# Patient Record
Sex: Female | Born: 1950 | Race: Black or African American | Hispanic: No | Smoking: Never smoker
Health system: Southern US, Community
[De-identification: ages and names within clinical notes are randomized; demographics above are authoritative.]

## PROBLEM LIST (undated history)

## (undated) DIAGNOSIS — E7800 Pure hypercholesterolemia, unspecified: Secondary | ICD-10-CM

## (undated) DIAGNOSIS — M259 Joint disorder, unspecified: Secondary | ICD-10-CM

## (undated) DIAGNOSIS — M159 Polyosteoarthritis, unspecified: Secondary | ICD-10-CM

## (undated) DIAGNOSIS — M1990 Unspecified osteoarthritis, unspecified site: Secondary | ICD-10-CM

## (undated) DIAGNOSIS — M722 Plantar fascial fibromatosis: Secondary | ICD-10-CM

## (undated) DIAGNOSIS — I10 Essential (primary) hypertension: Secondary | ICD-10-CM

## (undated) HISTORY — DX: Joint disorder, unspecified: M25.9

## (undated) HISTORY — DX: Pure hypercholesterolemia, unspecified: E78.00

## (undated) HISTORY — DX: Plantar fascial fibromatosis: M72.2

## (undated) HISTORY — DX: Morbid (severe) obesity due to excess calories: E66.01

## (undated) HISTORY — DX: Unspecified osteoarthritis, unspecified site: M19.90

## (undated) HISTORY — DX: Polyosteoarthritis, unspecified: M15.9

## (undated) HISTORY — DX: Essential (primary) hypertension: I10

---

## 2003-11-16 ENCOUNTER — Ambulatory Visit (HOSPITAL_COMMUNITY): Admission: RE | Admit: 2003-11-16 | Discharge: 2003-11-16 | Payer: Self-pay | Admitting: Internal Medicine

## 2005-09-24 ENCOUNTER — Ambulatory Visit (HOSPITAL_COMMUNITY): Admission: RE | Admit: 2005-09-24 | Discharge: 2005-09-24 | Payer: Self-pay | Admitting: Internal Medicine

## 2006-07-11 ENCOUNTER — Inpatient Hospital Stay (HOSPITAL_COMMUNITY): Admission: EM | Admit: 2006-07-11 | Discharge: 2006-07-18 | Payer: Self-pay | Admitting: Emergency Medicine

## 2010-05-04 ENCOUNTER — Encounter: Payer: Self-pay | Admitting: Internal Medicine

## 2010-06-11 ENCOUNTER — Encounter: Payer: Self-pay | Admitting: Orthopedic Surgery

## 2010-06-30 ENCOUNTER — Encounter: Payer: Self-pay | Admitting: Orthopedic Surgery

## 2010-07-02 ENCOUNTER — Encounter: Payer: Self-pay | Admitting: Orthopedic Surgery

## 2010-07-02 ENCOUNTER — Ambulatory Visit (INDEPENDENT_AMBULATORY_CARE_PROVIDER_SITE_OTHER): Payer: Managed Care, Other (non HMO) | Admitting: Orthopedic Surgery

## 2010-07-02 VITALS — HR 70 | Resp 18 | Ht 67.0 in | Wt 321.0 lb

## 2010-07-02 DIAGNOSIS — M1712 Unilateral primary osteoarthritis, left knee: Secondary | ICD-10-CM

## 2010-07-02 NOTE — Patient Instructions (Addendum)
Come back in 6 mos  If things worsen before that call us for a quicker appointment

## 2010-07-02 NOTE — Discharge Summary (Signed)
Separately identifiable x-ray report  AP lateral and patellofemoral view of LEFT knee  The alignment is Abnormal with severe varus, the joint spaces medially have collapsed to bone on bone. The bone quality appears to be normal. There are multiple osteophytes.  The impression is severe varus osteoarthritis.

## 2010-07-02 NOTE — Progress Notes (Signed)
   The patient is having pain in her LEFT knee for the last 12 years originally followed by orthopedist and even with since LEFT hand. She complains of sharp 10 out of 10 pain, which is constant and better with rest, worse with exercise. Notes particularly having trouble climbing the steps.  She takes tramadol for pain, which gives her really good relief.  She has had injections in this knee in the past.  Presents now for evaluation of her LEFT knee.  Review of systems patient reports weight gain and joint pain. Denies blurred vision chest pain, shortness of breath, heartburn, frequency, changes in the skin, numbness, nervousness, easy bleeding or bruising excessive thirst or adverse reactions to food  Vital signs are recorded as rate 312. Height 5 foot 7.  General: The patient is normally developed, with normal grooming and hygiene. There are no gross deformities. The body habitus is obese CDV: The pulse and perfusion of the extremities are normal   LYMPH: There is no gross lymphadenopathy in the extremities   Skin: There are no rashes, ulcers or cafe-au-lait spot   Psyche: The patient is alert, awake and oriented.  Mood is normal   Neuro:  The coordination and balance are normal.  Sensation is normal. Reflexes are 2+ and equal   Musculoskeletal   Upper extremity exam  Inspection and palpation revealed no abnormalities in the upper extremities.  Range of motion is full without contracture.  Motor exam is normal with grade 5 strength.  The joints are fully reduced without subluxation.  There is no atrophy or tremor and muscle tone is normal.  All joints are stable.   LEFT knee exam flexion is 125 she has full extension. Her x-rays exhibit varus, but she doesn't appear to be in varus when she stands. She has medial joint line tenderness. Her knee remained stable. Strength and muscle tone are normal. No joint effusion. RIGHT knee exam flexion 105. Stability normal. Motor  exam. Grade 5. Also, medial join tline tenderness seen. No joint effusion.

## 2011-01-06 ENCOUNTER — Encounter: Payer: Self-pay | Admitting: Orthopedic Surgery

## 2011-01-06 ENCOUNTER — Ambulatory Visit: Payer: Managed Care, Other (non HMO) | Admitting: Orthopedic Surgery

## 2011-03-31 ENCOUNTER — Ambulatory Visit: Payer: Managed Care, Other (non HMO) | Admitting: Orthopedic Surgery

## 2011-04-28 ENCOUNTER — Ambulatory Visit: Payer: Managed Care, Other (non HMO) | Admitting: Orthopedic Surgery

## 2011-05-21 ENCOUNTER — Ambulatory Visit: Payer: Managed Care, Other (non HMO) | Admitting: Orthopedic Surgery

## 2011-06-04 ENCOUNTER — Encounter: Payer: Self-pay | Admitting: Orthopedic Surgery

## 2011-06-04 ENCOUNTER — Ambulatory Visit (INDEPENDENT_AMBULATORY_CARE_PROVIDER_SITE_OTHER): Payer: Managed Care, Other (non HMO) | Admitting: Orthopedic Surgery

## 2011-06-04 VITALS — BP 100/60 | Ht 67.0 in | Wt 306.0 lb

## 2011-06-04 DIAGNOSIS — M1710 Unilateral primary osteoarthritis, unspecified knee: Secondary | ICD-10-CM

## 2011-06-04 DIAGNOSIS — M179 Osteoarthritis of knee, unspecified: Secondary | ICD-10-CM

## 2011-06-04 NOTE — Patient Instructions (Signed)
Call us when ready for knee replacement

## 2011-06-04 NOTE — Progress Notes (Signed)
Patient ID: Everlean Patterson, female   DOB: 1950-05-10, 61 y.o.   MRN: [id number] Chief Complaint  Patient presents with  . Follow-up    6 month recheck/x ray Lt knee   The patient has pain in her LEFT knee with weightbearing and it seems to be getting worse.  She inquired about Synvisc but she has grade 4 disease and we talked about it but decided not to go with that.  She's noticed her activities are becoming more difficult such as squatting bending kneeling getting in and out of a chair.  Fortunately the knee has not given out on her at this point  Exam shows he will be sent female well-developed well-nourished oriented x3 normal mood normal affect and plate without assistive device  She has a large thigh  cone-shaped tenderness medial and lateral compartments , medial predominant.  Knee stable.  Extension power normal skin intact pulse normal no lymph nodes sensation normal no pathological reflexes now is pretty good  X-ray shows grade 4 medial compartment disease with moderate varus deformity multiple bone spurs cyst formation and osteophytes and sclerosis  Date of x-ray June 04, 2011  Impression severe osteoarthritis LEFT knee  As we discussed the patient will need knee replacement surgery when her symptoms warrant he reaches to think about it talk to her husband and give back to [place] when she is ready for surgery.  She has exhausted nonoperative measures of injection oral anti-inflammatory naproxen oral medication tramadol most recently.

## 2011-06-04 NOTE — Progress Notes (Signed)
X-ray LEFT knee 3 views date June 04, 2011  The knee is in varus moderate The knee has complete loss of joint space medially with sclerosis, cyst formation and bone spurs.  The patellofemoral joint his disease as well with severe osteophytosis  Impression osteoarthritis LEFT knee severe varus deformity and disease.

## 2011-08-04 ENCOUNTER — Other Ambulatory Visit: Payer: Self-pay | Admitting: *Deleted

## 2011-08-04 ENCOUNTER — Telehealth: Payer: Self-pay | Admitting: Orthopedic Surgery

## 2011-08-04 NOTE — Telephone Encounter (Signed)
Surgery scheduled, called patient, left voicemail

## 2011-08-04 NOTE — Telephone Encounter (Signed)
Patient called about scheduling her surgery (total knee), for sometime in May.  States she needs a little time to make arrangements with work (at [employer].)  States "both knees are bad."    Notes from last visit here 06/03/01 do not indicate that patient needs to return to office for another visit prior to surgery.  Patient said she has not been to a class.  Please advise.  Ph# [phone] (Home)

## 2011-08-06 NOTE — Telephone Encounter (Signed)
Have left two voice mails for patient, will advise her of surgery when she returns call

## 2011-08-13 ENCOUNTER — Encounter: Payer: Self-pay | Admitting: Orthopedic Surgery

## 2011-08-13 ENCOUNTER — Telehealth: Payer: Self-pay | Admitting: Orthopedic Surgery

## 2011-08-13 NOTE — Telephone Encounter (Signed)
Reached patient.  Had sent letter regarding surgery date, pre-op, post op appointments, as had been unable to reach patient past few days with this information.  Relayed to patient today, 08/13/11.

## 2011-08-17 ENCOUNTER — Telehealth: Payer: Self-pay | Admitting: Orthopedic Surgery

## 2011-08-17 NOTE — Telephone Encounter (Signed)
Contacted insurer, [place], at ph [phone], regarding in-patient admit for surgery scheduled 08/31/11 at [facility].  Reached voice message system; left message and requested return call.  (Follow up by tomorrow 08/18/11, if no return call.)

## 2011-08-18 NOTE — Telephone Encounter (Signed)
Received call back - advised to contact pre-auth back during day hours at Ph#noted.

## 2011-08-19 NOTE — Telephone Encounter (Signed)
See updated note, patient was reached 08/13/11 and received all related information.

## 2011-08-19 NOTE — Telephone Encounter (Signed)
Reached Felicity Coyer in pre-authorization department at [employer], ph [phone].  Patient is approved for 3-day stay beginning 08/31/11 for CPT 27447, ICD9 [phone] total knee arthroplasty, [facility].  REF# [id number].

## 2011-08-24 ENCOUNTER — Telehealth: Payer: Self-pay | Admitting: Orthopedic Surgery

## 2011-08-24 NOTE — Telephone Encounter (Signed)
Patient called earlier today to relay that she needs to cancel her total knee surgery for 08/31/11; states she and husband have re-discussed it and she needs to wait awhile.  Nurse contacted all related parties.  I have called back to insurance company, [place] [employer], ph# [phone]; spoke with Blenda Nicely, who states that this authorization/reference: REF# [id number] is good for 90 days from pre-cert, therefore, good until 11/19/11, should patient wish to re-schedule before then. *We will need to close it out if patient does not have surgery by this date.

## 2011-08-27 ENCOUNTER — Encounter (HOSPITAL_COMMUNITY): Admission: RE | Admit: 2011-08-27 | Payer: PRIVATE HEALTH INSURANCE | Source: Ambulatory Visit

## 2011-08-31 ENCOUNTER — Encounter (HOSPITAL_COMMUNITY): Admission: RE | Payer: Self-pay | Source: Ambulatory Visit

## 2011-08-31 ENCOUNTER — Inpatient Hospital Stay (HOSPITAL_COMMUNITY)
Admission: RE | Admit: 2011-08-31 | Payer: PRIVATE HEALTH INSURANCE | Source: Ambulatory Visit | Admitting: Orthopedic Surgery

## 2011-08-31 SURGERY — ARTHROPLASTY, KNEE, TOTAL
Anesthesia: Choice | Laterality: Left

## 2011-09-02 ENCOUNTER — Other Ambulatory Visit (HOSPITAL_COMMUNITY): Payer: Managed Care, Other (non HMO)

## 2011-09-14 ENCOUNTER — Ambulatory Visit: Payer: PRIVATE HEALTH INSURANCE | Admitting: Orthopedic Surgery

## 2011-10-13 ENCOUNTER — Telehealth: Payer: Self-pay | Admitting: Orthopedic Surgery

## 2011-10-13 NOTE — Telephone Encounter (Signed)
Patient called to relay that she wishes to re-schedule the total knee replacement surgery that she cancelled for 08/31/11.  States her last day of work at [employer] is today; therefore, she can schedule any time.  Her last office visit here was 06/04/11.  Please advise if need to schedule another appointment or prior to scheduling?  I advised we would need to re-verify her insurance as well. Her phone # is [phone].

## 2011-11-18 NOTE — Telephone Encounter (Signed)
Patient has an appointment scheduled, tomorrow, 11/19/11. Aware.

## 2011-11-19 ENCOUNTER — Encounter: Payer: Self-pay | Admitting: Orthopedic Surgery

## 2011-11-19 ENCOUNTER — Ambulatory Visit (INDEPENDENT_AMBULATORY_CARE_PROVIDER_SITE_OTHER): Payer: PRIVATE HEALTH INSURANCE

## 2011-11-19 ENCOUNTER — Ambulatory Visit (INDEPENDENT_AMBULATORY_CARE_PROVIDER_SITE_OTHER): Payer: PRIVATE HEALTH INSURANCE | Admitting: Orthopedic Surgery

## 2011-11-19 VITALS — BP 120/70 | Ht 67.0 in | Wt 308.0 lb

## 2011-11-19 DIAGNOSIS — M25569 Pain in unspecified knee: Secondary | ICD-10-CM

## 2011-11-19 DIAGNOSIS — M25561 Pain in right knee: Secondary | ICD-10-CM

## 2011-11-19 NOTE — Patient Instructions (Addendum)
You have been scheduled for knee replacement surgery.  All surgeries carry some risk.  Remember you always have the option of continued nonsurgical treatment. However in this situation the risks vs. the benefits favor surgery as the best treatment option. The risks of the surgery includes the following but is not limited to bleeding, infection, pulmonary embolus, death from anesthesia, nerve injury vascular injury or need for further surgery, continued pain.  Specific to this procedure the following risks and complications are rare but possible Stiffness Pain  Infection which may require several subsequent surgeries including an amputation of the infection cannot be removed Instability   You have been scheduled for surgery  Please Go to your preoperative appointment and bring the folder that was given to you today  Please stop all blood thinners ibuprofen Naprosyn aspirin Plavix Coumadin  Total Knee Replacement In total knee replacement, the damaged knee is replaced with an artificial knee joint (prosthesis). The purpose of this surgery is to reduce pain and improve your range of motion. Regaining a near normal range of motion of your knee in the first 3 to 6 weeks after surgery is critical. Generally, these artificial joints last a minimum of 10 years. By that time, about 1 in 10 patients will need another surgery to repair the loose prosthesis. LET YOUR CAREGIVER KNOW ABOUT:    Allergies to food or medicine.   Medicines taken, including vitamins, herbs, eyedrops, over-the-counter medicines, and creams.   Use of steroids (by mouth or creams).   Previous problems with anesthetics or numbing medicines.   History of bleeding problems or blood clots.   Previous surgery.   Other health problems, including diabetes and kidney problems.   Possibility of pregnancy, if this applies.  RISKS AND COMPLICATIONS    Knee pain.   Loss of range of motion of the knee (stiffness) or instability.     Loosening of the prosthesis.   Infection.  BEFORE THE PROCEDURE    If you smoke, quit.   You may need a replacement or addition of blood (transfusion) during this procedure. You may want to donate your own blood for storage several weeks before the procedure. This way, your own blood can be stored and given to you if needed. Talk to your surgeon about this option.   Do not eat or drink anything for as long as directed by your caregiver before the procedure.   You may bathe and brush your teeth before the procedure. Do not swallow the water when brushing your teeth.   Scrub the area to be operated on for 5 minutes in the morning before the procedure. Use special soap if you are directed to do so by your caregiver.   Take your regular medicines with a small sip of water unless otherwise instructed. Your caregiver will let you know if there are medicines that need to be stopped and for how long.   You should be present 60 minutes before your procedure or as directed by your caregiver.  PROCEDURE   Before surgery, an intravenous (IV) access for giving fluids will be started. You will be given medicines and/or gas to make you sleep (anesthetic). You may be given medicines in your back with a needle to make you numb from the waist down. Your surgeon will take out any damaged cartilage and bone. He or she will then put in new metal, plastic, or ceramic joint surfaces to restore alignment and function to your knee. AFTER THE PROCEDURE   You  will be taken to the recovery area where a nurse will watch and check your progress. You may have a long, narrow tube (catheter) in your bladder after surgery. The catheter helps you empty your bladder (pass your urine). You may have drainage tubes coming out from under the dressing. These tubes attach to a device that removes blood or fluids that gather after surgery. Once you are awake, stable, and taking fluids well, you will be returned to your room. You will  receive physical therapy as prescribed by your caregiver. If you do not have help at home, you may need to go to an extended care facility for a few weeks. If you are sent home with a continuous passive motion (CPM) machine, use it as instructed. Document Released: 07/06/2000 Document Revised: 03/19/2011 Document Reviewed: 01/30/2009 [facility] Patient Information 2012 [place], [place].Preparing for Knee Replacement Recovery from knee replacement surgery can be made easier and more comfortable by taking steps to be prepared before surgery. This includes:  Arranging for others to help you.   Preparing your home.   Making sure your body is prepared by doing a pre-operative exam and being as healthy as you can.   Doing exercises before your surgery as told by your caregiver.  Also, you can ease any concerns about your financial responsibilities by calling your insurance company after you decide to have surgery. In addition to your surgery and hospital stay, you will want to ask about your coverage for medical equipment, rehab facilities, and home care. ARRANGING FOR HELP   You will be getting stronger and more mobile every day. However, in the first couple weeks after surgery, it is unlikely you will be able to do all your daily activities as easily as before your surgery. You will tire easily and still have limited movement in your leg. Follow these guidelines to best arrange for the help you may need after your surgery:  Arrange for someone to drive you home from the hospital. Your surgeon will be able to tell you how many days you can expect to be in the hospital.   Cancel all work, care-giving, and volunteer responsibilities for at least 4 to 6 weeks after your surgery.   If you live alone, arrange for someone to care for your home and pets for the first 4 to 6 weeks.   Select someone with whom you feel comfortable to be with you day and night for the first week. This person will help you with  your exercises and personal care, like bathing and using the toilet.   Arrange for drivers to bring you to and from your doctor and therapy appointments, as well as to the grocery store and other places you need to go, for at least 4 to 6 weeks.   Select 2 or 3 rehab facilities where you would be comfortable recovering just in case you are not able to go directly home to recover.  PREPARING YOUR HOME  Remove all clutter from your floors.   To see if you will be able to move in these spaces with a wheeled walker, hold your hands out about 6 inches from your sides. Then walk from your bed to the bathroom. Walk from your resting spot to your kitchen and bathroom. If you do not hit anything with your hands, you probably have enough room.   Remove throw rugs.   Move the items you use often to shelves and drawers that are at countertop height.  Move items in  your bathroom, kitchen, and bedroom.   Prepare a few meals that you can freeze and reheat later.   Do not plan on recovering in bed.  It is better for your health to sit upright. You may wish to use a recliner with a small table nearby. Keep the items you use most frequently on that table. These may include the TV remote, a cordless phone, a book or laptop computer, water glass, and any other items of your choice.   Consider adding grab bars in the shower and near the toilet.   While you are in the hospital, you will learn about equipment helpful for your recovery. Some of the equipment includes raised toilet seats, tub benches, and shower benches. Often, your hospital care team will help you decide what you need and can direct you about where to buy these items. You may not need all of these items, and they are not often returnable, so it is not recommended that you buy them before going to the hospital.  PREPARING YOUR BODY  Complete a pre-operative exam. This will ensure that your body is healthy enough to safely complete this surgery. Be  certain to bring a complete list of all your medicines and supplements (herbs and vitamins). You may be advised to have additional tests to ensure your safety.   Complete elective dental care and routine cleanings before the surgery. Germs from anywhere in your body, including those in your mouth, can travel to your new joint and infect it.  It is important not to have any dental work performed for at least 3 months after your surgery. After surgery, be sure to tell your dentist about your joint replacement.   Maintain a healthy diet. Unless advised by your surgeon, do not drastically change your diet before your surgery.   Quit smoking. Get help from your caregiver if you need it.   The day before your surgery, follow your surgeon's directions for showering, eating and drinking. These directions are for your safety.  EXERCISES Your caregiver may have you do the following exercises before your surgery. Be sure to follow the exercise program your caregiver prescribes for you. Doing the exercises on both sides will help prepare your "good" side as well. While completing these exercises, remember:    Stretch as long as you can, up to 30 seconds, without pain developing.   You should only feel a gentle lengthening or release in the stretched tissue.  Ankle Pumps 1. While sitting with your knees straight, draw the top of your feet upwards by flexing your ankles.   2. Then, reverse the motion, pointing your toes downward.  3. Repeat 10 to 20 times. Complete this exercise 1 to 2 times per day.  Heel Slides 1. Lie on your back with both knees straight. (If this causes back discomfort, bend your opposite knee, placing your foot flat on the floor.)  2. Slowly slide your heel back toward your buttocks until you feel a gentle stretch in the front of your knee or thigh.  3. Slowly slide your heel back to the starting position.  4. Repeat 10 to 20 times. Complete this exercise 1 to 2 times per day.    Quadriceps Sets 1. Lie on your back with your sore leg extended and your opposite knee bent.  2. Gradually tense the muscles in the front of your thigh. You should see either your kneecap slide up toward your hip or increased dimpling just above the knee. This motion  will push the back of the knee down toward the floor.   3. Hold the muscle as tight as you can without increasing your pain for 10 seconds.  4. Relax the muscles slowly and completely in between each repetition.  5. Repeat 10 to 20 times. Complete this exercise 1 to 2 times per day.  Short Arc Kicks 1. Lie on your back. Place a 4 to 6 inch towel roll under your sore knee so that the knee slightly bends.  2. Raise only your lower leg by tightening the muscles in the front of your thigh. Do not allow your thigh to rise.  3. Hold this position for 5 seconds.  4. Repeat 10 to 20 times. Complete this exercise 1 to 2 times per day.  Straight Leg Raises 1. Lie on your back with your sore leg extended and your opposite knee bent.   2. Tense the muscles in the front of your sore thigh. You should see either your kneecap slide up or increased dimpling just above the knee. Your thigh may even quiver.  3. Tighten these muscles even more and raise your leg 4 to 6 inches off the floor. Hold for 3 to 5 seconds.  4. Keeping these muscles tense, lower your leg.  5. Relax the muscles slowly and completely in between each repetition.  6. Repeat 10 to 20 times. Complete this exercise 1 to 2 times per day.  Arm Chair Push-ups 1. Find a firm, non-wheeled chair with solid armrests.  2. Sitting in the chair, extend your sore leg straight out in front of you.  3. Lift up your body weight, using your arms and opposite leg.  4. Slowly lower your body weight.   5. Repeat 10 to 20 times. Complete this exercise 1 to 2 times per day.  Document Released: 07/04/2010 Document Revised: 03/19/2011 Document Reviewed: 07/04/2010 [facility] Patient Information 2012  [place], [place].

## 2011-11-19 NOTE — Progress Notes (Signed)
Patient ID: [name] Price, female   DOB: 1950-04-21, 61 y.o.   MRN: [id number] Chief Complaint  Patient presents with  . Follow-up    discuss TKA, was previously scheduled for left tka but cancelled, states today that right knee is worse than left    BP 120/70  Ht 5\' 7"  (1.702 m)  Wt 308 lb (139.708 kg)  BMI 48.24 kg/m2  Past Medical History  Diagnosis Date  . Diabetes mellitus   . HTN (hypertension)   . High cholesterol   . Arthritis     No past surgical history on file.  Current Outpatient Prescriptions on File Prior to Visit  Medication Sig Dispense Refill  . GLIPIZIDE PO Take by mouth.        Marland Kitchen LISINOPRIL PO Take by mouth.        . METFORMIN HCL PO Take by mouth.        Marland Kitchen NAPROXEN PO Take by mouth.        Marland Kitchen SIMVASTATIN PO Take by mouth.        . TRAMADOL HCL PO Take by mouth.         History  Substance Use Topics  . Smoking status: Never Smoker   . Smokeless tobacco: Not on file  . Alcohol Use: Not on file   See h/p by ref

## 2011-11-20 ENCOUNTER — Telehealth: Payer: Self-pay | Admitting: Orthopedic Surgery

## 2011-11-20 NOTE — Telephone Encounter (Signed)
Contacted insurer, [employer] at ph # [phone], (the contact provider for [place] [employer]) for pre-authorization for surgery (in-patient/admit).  Surgery schedule date is 11/30/11 at [facility], CPT code 09811, ICD9 [phone] total knee arthroplasty;  Reached automated message (X two) - states "technical difficulty" and to please try back later, as they are working to resolve the issue.

## 2011-11-23 ENCOUNTER — Encounter (HOSPITAL_COMMUNITY): Payer: Self-pay

## 2011-11-23 ENCOUNTER — Encounter (HOSPITAL_COMMUNITY)
Admission: RE | Admit: 2011-11-23 | Discharge: 2011-11-23 | Disposition: A | Discharge status: Home / Self Care | Payer: 59 | Source: Ambulatory Visit | Attending: Orthopedic Surgery | Admitting: Orthopedic Surgery

## 2011-11-23 LAB — CBC WITH DIFFERENTIAL/PLATELET
Basophils Absolute: 0 10*3/uL (ref 0.0–0.1)
Basophils Relative: 0 % (ref 0–1)
Lymphocytes Relative: 33 % (ref 12–46)
Neutro Abs: 4.2 10*3/uL (ref 1.7–7.7)
Neutrophils Relative %: 60 % (ref 43–77)
Platelets: 212 10*3/uL (ref 150–400)
RDW: 14 % (ref 11.5–15.5)
WBC: 6.9 10*3/uL (ref 4.0–10.5)

## 2011-11-23 LAB — SURGICAL PCR SCREEN: MRSA, PCR: NEGATIVE

## 2011-11-23 LAB — BASIC METABOLIC PANEL
Calcium: 9.9 mg/dL (ref 8.4–10.5)
Chloride: 99 mEq/L (ref 96–112)
Creatinine, Ser: 0.77 mg/dL (ref 0.50–1.10)
GFR calc Af Amer: >90 mL/min (ref >90)
GFR calc non Af Amer: 88 mL/min — ABNORMAL LOW (ref >90)

## 2011-11-23 LAB — PROTIME-INR: INR: 0.96 (ref 0.00–1.49)

## 2011-11-23 LAB — APTT: aPTT: 26 seconds (ref 24–37)

## 2011-11-23 LAB — PREPARE RBC (CROSSMATCH)

## 2011-11-23 MED ORDER — CHLORHEXIDINE GLUCONATE 4 % EX LIQD: 60.0000 mL | Freq: Once | CUTANEOUS | Status: DC

## 2011-11-23 NOTE — Patient Instructions (Addendum)
20 [name] Price  11/23/2011   Your procedure is scheduled on:  11/30/11  Report to [facility] at 06:15 AM.  Call this number if you have problems the morning of surgery: [phone]   Remember:   Do not eat or drink:After Midnight.  Take these medicines the morning of surgery with A SIP OF WATER: Lisinopril-HCTZ   Do not wear jewelry, make-up or nail polish.  Do not wear lotions, powders, or perfumes. You may wear deodorant.  Do not shave 48 hours prior to surgery. Men may shave face and neck.  Do not bring valuables to the hospital.  Contacts, dentures or bridgework may not be worn into surgery.   Patients discharged the day of surgery will not be allowed to drive home.  Special Instructions: CHG Shower Use Special Wash: 1/2 bottle night before surgery and 1/2 bottle morning of surgery.   Please read over the following fact sheets that you were given: Pain Booklet, Blood Transfusion Information, Total Joint Packet, MRSA Information, Surgical Site Infection Prevention, Anesthesia Post-op Instructions and Care and Recovery After Surgery    Total Knee Replacement In total knee replacement, the damaged knee is replaced with an artificial knee joint (prosthesis). The purpose of this surgery is to reduce pain and improve your range of motion. Regaining a near normal range of motion of your knee in the first 3 to 6 weeks after surgery is critical. Generally, these artificial joints last a minimum of 10 years. By that time, about 1 in 10 patients will need another surgery to repair the loose prosthesis. LET YOUR CAREGIVER KNOW ABOUT:   Allergies to food or medicine.   Medicines taken, including vitamins, herbs, eyedrops, over-the-counter medicines, and creams.   Use of steroids (by mouth or creams).   Previous problems with anesthetics or numbing medicines.   History of bleeding problems or blood clots.   Previous surgery.   Other health problems, including diabetes and kidney  problems.   Possibility of pregnancy, if this applies.  RISKS AND COMPLICATIONS   Knee pain.   Loss of range of motion of the knee (stiffness) or instability.   Loosening of the prosthesis.   Infection.  BEFORE THE PROCEDURE   If you smoke, quit.   You may need a replacement or addition of blood (transfusion) during this procedure. You may want to donate your own blood for storage several weeks before the procedure. This way, your own blood can be stored and given to you if needed. Talk to your surgeon about this option.   Do not eat or drink anything for as long as directed by your caregiver before the procedure.   You may bathe and brush your teeth before the procedure. Do not swallow the water when brushing your teeth.   Scrub the area to be operated on for 5 minutes in the morning before the procedure. Use special soap if you are directed to do so by your caregiver.   Take your regular medicines with a small sip of water unless otherwise instructed. Your caregiver will let you know if there are medicines that need to be stopped and for how long.   You should be present 60 minutes before your procedure or as directed by your caregiver.  PROCEDURE  Before surgery, an intravenous (IV) access for giving fluids will be started. You will be given medicines and/or gas to make you sleep (anesthetic). You may be given medicines in your back with a needle to make you numb  from the waist down. Your surgeon will take out any damaged cartilage and bone. He or she will then put in new metal, plastic, or ceramic joint surfaces to restore alignment and function to your knee. AFTER THE PROCEDURE  You will be taken to the recovery area where a nurse will watch and check your progress. You may have a long, narrow tube (catheter) in your bladder after surgery. The catheter helps you empty your bladder (pass your urine). You may have drainage tubes coming out from under the dressing. These tubes attach  to a device that removes blood or fluids that gather after surgery. Once you are awake, stable, and taking fluids well, you will be returned to your room. You will receive physical therapy as prescribed by your caregiver. If you do not have help at home, you may need to go to an extended care facility for a few weeks. If you are sent home with a continuous passive motion (CPM) machine, use it as instructed. Document Released: 07/06/2000 Document Revised: 03/19/2011 Document Reviewed: 01/30/2009 [facility] Patient Information 2012 [place], [place].   PATIENT INSTRUCTIONS POST-ANESTHESIA  IMMEDIATELY FOLLOWING SURGERY:  Do not drive or operate machinery for the first twenty four hours after surgery.  Do not make any important decisions for twenty four hours after surgery or while taking narcotic pain medications or sedatives.  If you develop intractable nausea and vomiting or a severe headache please notify your doctor immediately.  FOLLOW-UP:  Please make an appointment with your surgeon as instructed. You do not need to follow up with anesthesia unless specifically instructed to do so.  WOUND CARE INSTRUCTIONS (if applicable):  Keep a dry clean dressing on the anesthesia/puncture wound site if there is drainage.  Once the wound has quit draining you may leave it open to air.  Generally you should leave the bandage intact for twenty four hours unless there is drainage.  If the epidural site drains for more than 36-48 hours please call the anesthesia department.  QUESTIONS?:  Please feel free to call your physician or the hospital operator if you have any questions, and they will be happy to assist you.

## 2011-11-23 NOTE — Telephone Encounter (Signed)
Called back to try again. Reached voice message.  Follow back up to speak w/rep.

## 2011-11-24 NOTE — Telephone Encounter (Addendum)
Called back again to reach pre-cert department re: request pre-authorization, due to "technical difficulty" ; this time given ph# of [employer]'s direct ph# [phone]; reached pre-certification representative, Erika C.  Received approval for 3 day stay for CPT 27447, ICD9 715.96, 715.16, under Pre-auth# [id number], subject to verification by [facility] upon receipt of claim.

## 2011-11-25 ENCOUNTER — Telehealth: Payer: Self-pay | Admitting: Orthopedic Surgery

## 2011-11-25 NOTE — Telephone Encounter (Signed)
Patient called, relayed that [facility], at pre-op appointment, had asked patient to contact our office to ask if she would need any equipment following surgery, such as wheelchair, walker, etc. Patient ph# is [phone].

## 2011-11-26 NOTE — Telephone Encounter (Signed)
Advised patient this will be handled before she leaves the hospital

## 2011-11-29 NOTE — H&P (Signed)
[name] Price is an 61 y.o. female.   Chief Complaint: Right knee pain times several  HPI: This 61 year old female presents with a severely diseased right knee. She complains of severe pain in the right knee nonradiating associated with stiffness a feeling of locking of the joint. She has lost her ability to perform normal activities of daily living including squatting kneeling bending getting out of a chair sitting for long periods of time. She has failed nonoperative treatment and wishes to proceed with knee replacement surgery. She has been counseled on the risks and benefits of the procedure including written educational material.  Past Medical History  Diagnosis Date  . Diabetes mellitus   . HTN (hypertension)   . High cholesterol   . Arthritis     No past surgical history on file.  Family History  Problem Relation Age of Onset  . Heart disease    . Arthritis    . Lung disease    . Kidney disease    . Coronary artery disease Mother   . Diabetes Sister   . Heart attack Brother   . Diabetes Son    Social History:  reports that she has never smoked. She does not have any smokeless tobacco history on file. She reports that she does not drink alcohol or use illicit drugs.  Allergies: No Known Allergies  No prescriptions prior to admission    No results found for this or any previous visit (from the past 48 hour(s)). No results found.  Review of Systems  Constitutional: Negative for fever.  Eyes: Negative for blurred vision.  Respiratory: Negative for cough.   Cardiovascular: Negative for chest pain.  Gastrointestinal: Negative for constipation and blood in stool.  Genitourinary: Negative for dysuria.  Musculoskeletal: Positive for joint pain.  Skin: Negative for rash.  Neurological: Negative for dizziness and headaches.  Endo/Heme/Allergies: Does not bruise/bleed easily.  Psychiatric/Behavioral: Negative for depression.    There were no vitals taken for this  visit. Physical Exam  Constitutional: She is oriented to person, place, and time. She appears well-developed and well-nourished.  HENT:  Head: Normocephalic.  Eyes: Pupils are equal, round, and reactive to light.  Neck: Normal range of motion. No tracheal deviation present.  Cardiovascular: Normal rate and intact distal pulses.   Respiratory: Effort normal. No stridor.  GI: She exhibits no distension.  Lymphadenopathy:    She has no cervical adenopathy.  Neurological: She is alert and oriented to person, place, and time. She has normal reflexes. She displays normal reflexes. No cranial nerve deficit. She exhibits normal muscle tone. Coordination normal.  Skin: Skin is warm and dry. No rash noted. No erythema.  Psychiatric: She has a normal mood and affect. Her behavior is normal. Judgment and thought content normal.  Right Knee Exam   Tenderness  The patient is experiencing tenderness in the lateral retinaculum and medial joint line.  Range of Motion  Extension: 5  Flexion: 120   Muscle Strength   The patient has normal right knee strength.  Tests  McMurray:  Medial - negative Lateral - negative Drawer:       Anterior - negative    Posterior - negative Varus: negative Valgus: negative Patellar Apprehension: negative  Other  Erythema: absent Scars: absent Sensation: normal Pulse: present Swelling: none   Left Knee Exam   Tenderness  The patient is experiencing tenderness in the medial joint line and lateral retinaculum.  Range of Motion  Extension: 5  Flexion: 120  Muscle Strength   The patient has normal left knee strength.  Tests  McMurray:  Medial - negative Lateral - negative Drawer:       Anterior - negative     Posterior - negative Varus: negative Valgus: negative Patellar Apprehension: negative  Other  Erythema: absent Scars: absent Sensation: normal Pulse: present Swelling: none    Upper extremity exam  Inspection and palpation  revealed no abnormalities in the upper extremities.  Range of motion is full without contracture.  Motor exam is normal with grade 5 strength.  The joints are fully reduced without subluxation.  There is no atrophy or tremor and muscle tone is normal.  All joints are stable.   IMAGING: OA RIGHT KNEE   Assessment/Plan OA RIGHT KNEE   RT TKA  This procedure has been fully reviewed with the patient and written informed consent has been obtained.   [name] Price 11/29/2011, 9:22 AM

## 2011-11-30 ENCOUNTER — Inpatient Hospital Stay (HOSPITAL_COMMUNITY): Payer: 59 | Admitting: Anesthesiology

## 2011-11-30 ENCOUNTER — Inpatient Hospital Stay (HOSPITAL_COMMUNITY)
Admission: RE | Admit: 2011-11-30 | Discharge: 2011-12-03 | DRG: 470 | Disposition: A | Discharge status: Home Health | Payer: 59 | Source: Ambulatory Visit | Attending: Orthopedic Surgery | Admitting: Orthopedic Surgery

## 2011-11-30 ENCOUNTER — Other Ambulatory Visit: Payer: Self-pay | Admitting: *Deleted

## 2011-11-30 ENCOUNTER — Encounter (HOSPITAL_COMMUNITY): Payer: Self-pay | Admitting: *Deleted

## 2011-11-30 ENCOUNTER — Inpatient Hospital Stay (HOSPITAL_COMMUNITY): Payer: 59

## 2011-11-30 ENCOUNTER — Encounter (HOSPITAL_COMMUNITY): Payer: Self-pay | Admitting: Anesthesiology

## 2011-11-30 ENCOUNTER — Encounter (HOSPITAL_COMMUNITY)
Admission: RE | Disposition: A | Discharge status: Home Health | Payer: Self-pay | Source: Ambulatory Visit | Attending: Orthopedic Surgery

## 2011-11-30 DIAGNOSIS — IMO0002 Reserved for concepts with insufficient information to code with codable children: Secondary | ICD-10-CM

## 2011-11-30 DIAGNOSIS — I10 Essential (primary) hypertension: Secondary | ICD-10-CM | POA: Diagnosis present

## 2011-11-30 DIAGNOSIS — Z833 Family history of diabetes mellitus: Secondary | ICD-10-CM

## 2011-11-30 DIAGNOSIS — Z23 Encounter for immunization: Secondary | ICD-10-CM

## 2011-11-30 DIAGNOSIS — E119 Type 2 diabetes mellitus without complications: Secondary | ICD-10-CM | POA: Diagnosis present

## 2011-11-30 DIAGNOSIS — Z8249 Family history of ischemic heart disease and other diseases of the circulatory system: Secondary | ICD-10-CM

## 2011-11-30 DIAGNOSIS — E7800 Pure hypercholesterolemia, unspecified: Secondary | ICD-10-CM | POA: Diagnosis present

## 2011-11-30 DIAGNOSIS — M129 Arthropathy, unspecified: Secondary | ICD-10-CM | POA: Diagnosis present

## 2011-11-30 DIAGNOSIS — M1710 Unilateral primary osteoarthritis, unspecified knee: Principal | ICD-10-CM | POA: Diagnosis present

## 2011-11-30 HISTORY — PX: TOTAL KNEE ARTHROPLASTY: SHX125

## 2011-11-30 LAB — GLUCOSE, CAPILLARY: Glucose-Capillary: 156 mg/dL — ABNORMAL HIGH (ref 70–99)

## 2011-11-30 SURGERY — ARTHROPLASTY, KNEE, TOTAL
Anesthesia: Spinal | Site: Knee | Laterality: Right | Wound class: Clean

## 2011-11-30 MED ORDER — SENNOSIDES-DOCUSATE SODIUM 8.6-50 MG PO TABS
1.0000 | ORAL_TABLET | Freq: Every evening | ORAL | Status: DC | PRN
  Filled 2011-11-30: qty 1

## 2011-11-30 MED ORDER — BUPIVACAINE HCL (PF) 0.25 % IJ SOLN
INTRAMUSCULAR | Status: AC
  Filled 2011-11-30: qty 60

## 2011-11-30 MED ORDER — MIDAZOLAM HCL 2 MG/2ML IJ SOLN
INTRAMUSCULAR | Status: AC
  Filled 2011-11-30: qty 2

## 2011-11-30 MED ORDER — CELECOXIB 100 MG PO CAPS
200.0000 mg | ORAL_CAPSULE | Freq: Two times a day (BID) | ORAL | Status: DC
  Administered 2011-12-01 – 2011-12-03 (×5): 200 mg via ORAL
  Filled 2011-11-30 (×8): qty 2

## 2011-11-30 MED ORDER — ASPIRIN EC 325 MG PO TBEC
325.0000 mg | DELAYED_RELEASE_TABLET | Freq: Two times a day (BID) | ORAL | Status: DC
  Administered 2011-11-30 – 2011-12-03 (×7): 325 mg via ORAL
  Filled 2011-11-30 (×7): qty 1

## 2011-11-30 MED ORDER — BUPIVACAINE-EPINEPHRINE (PF) 0.5% -1:200000 IJ SOLN
INTRAMUSCULAR | Status: DC | PRN
  Administered 2011-11-30: 60 mL

## 2011-11-30 MED ORDER — PREGABALIN 50 MG PO CAPS
50.0000 mg | ORAL_CAPSULE | Freq: Once | ORAL | Status: AC
  Administered 2011-11-30: 50 mg via ORAL

## 2011-11-30 MED ORDER — OXYCODONE HCL 5 MG PO TABS
5.0000 mg | ORAL_TABLET | Freq: Once | ORAL | Status: AC
  Administered 2011-11-30: 5 mg via ORAL

## 2011-11-30 MED ORDER — CELECOXIB 100 MG PO CAPS
ORAL_CAPSULE | ORAL | Status: AC
  Filled 2011-11-30: qty 4

## 2011-11-30 MED ORDER — BUPIVACAINE IN DEXTROSE 0.75-8.25 % IT SOLN
INTRATHECAL | Status: AC
  Filled 2011-11-30: qty 2

## 2011-11-30 MED ORDER — PHENYLEPHRINE HCL 10 MG/ML IJ SOLN
INTRAMUSCULAR | Status: AC
  Filled 2011-11-30: qty 1

## 2011-11-30 MED ORDER — PREGABALIN 50 MG PO CAPS
ORAL_CAPSULE | ORAL | Status: AC
  Filled 2011-11-30: qty 1

## 2011-11-30 MED ORDER — EPHEDRINE SULFATE 50 MG/ML IJ SOLN
INTRAMUSCULAR | Status: DC | PRN
  Administered 2011-11-30 (×3): 10 mg via INTRAVENOUS

## 2011-11-30 MED ORDER — GLIPIZIDE 5 MG PO TABS
5.0000 mg | ORAL_TABLET | Freq: Two times a day (BID) | ORAL | Status: DC
  Administered 2011-11-30 – 2011-12-03 (×6): 5 mg via ORAL
  Filled 2011-11-30 (×6): qty 1

## 2011-11-30 MED ORDER — METOCLOPRAMIDE HCL 10 MG PO TABS: 5.0000 mg | ORAL_TABLET | Freq: Three times a day (TID) | ORAL | Status: DC | PRN

## 2011-11-30 MED ORDER — ONDANSETRON HCL 4 MG PO TABS: 4.0000 mg | ORAL_TABLET | Freq: Four times a day (QID) | ORAL | Status: DC | PRN

## 2011-11-30 MED ORDER — PROPOFOL 10 MG/ML IV EMUL
INTRAVENOUS | Status: DC | PRN
  Administered 2011-11-30: 50 ug/kg/min via INTRAVENOUS

## 2011-11-30 MED ORDER — ACETAMINOPHEN 10 MG/ML IV SOLN
1000.0000 mg | Freq: Four times a day (QID) | INTRAVENOUS | Status: AC
  Administered 2011-11-30 – 2011-12-01 (×3): 1000 mg via INTRAVENOUS
  Filled 2011-11-30 (×3): qty 100

## 2011-11-30 MED ORDER — ONDANSETRON HCL 4 MG/2ML IJ SOLN: 4.0000 mg | Freq: Four times a day (QID) | INTRAMUSCULAR | Status: DC | PRN

## 2011-11-30 MED ORDER — BUPIVACAINE ON-Q PAIN PUMP (FOR ORDER SET NO CHG)
INJECTION | Status: AC
  Filled 2011-11-30: qty 1

## 2011-11-30 MED ORDER — DEXTROSE 5 % IV SOLN
500.0000 mg | Freq: Four times a day (QID) | INTRAVENOUS | Status: DC | PRN
  Filled 2011-11-30: qty 5

## 2011-11-30 MED ORDER — PNEUMOCOCCAL VAC POLYVALENT 25 MCG/0.5ML IJ INJ
0.5000 mL | INJECTION | INTRAMUSCULAR | Status: AC
  Administered 2011-12-01: 0.5 mL via INTRAMUSCULAR
  Filled 2011-11-30: qty 0.5

## 2011-11-30 MED ORDER — LACTATED RINGERS IV SOLN
INTRAVENOUS | Status: DC
  Administered 2011-11-30: 1000 mL via INTRAVENOUS

## 2011-11-30 MED ORDER — BUPIVACAINE-EPINEPHRINE PF 0.5-1:200000 % IJ SOLN
INTRAMUSCULAR | Status: AC
  Filled 2011-11-30: qty 20

## 2011-11-30 MED ORDER — SENNA 8.6 MG PO TABS
1.0000 | ORAL_TABLET | Freq: Two times a day (BID) | ORAL | Status: DC
  Administered 2011-11-30 – 2011-12-03 (×7): 8.6 mg via ORAL
  Filled 2011-11-30 (×7): qty 1

## 2011-11-30 MED ORDER — ACETAMINOPHEN 10 MG/ML IV SOLN
1000.0000 mg | Freq: Once | INTRAVENOUS | Status: AC
  Administered 2011-11-30: 1000 mg via INTRAVENOUS

## 2011-11-30 MED ORDER — LIDOCAINE HCL (PF) 1 % IJ SOLN
INTRAMUSCULAR | Status: AC
  Filled 2011-11-30: qty 5

## 2011-11-30 MED ORDER — HYDROMORPHONE HCL PF 1 MG/ML IJ SOLN
0.5000 mg | INTRAMUSCULAR | Status: DC | PRN
  Administered 2011-11-30 (×3): 0.5 mg via INTRAVENOUS
  Filled 2011-11-30 (×3): qty 1

## 2011-11-30 MED ORDER — LIDOCAINE HCL (CARDIAC) 10 MG/ML IV SOLN
INTRAVENOUS | Status: DC | PRN
  Administered 2011-11-30: 50 mg via INTRAVENOUS

## 2011-11-30 MED ORDER — MENTHOL 3 MG MT LOZG
1.0000 | LOZENGE | OROMUCOSAL | Status: DC | PRN
  Filled 2011-11-30: qty 9

## 2011-11-30 MED ORDER — METOCLOPRAMIDE HCL 5 MG/ML IJ SOLN: 5.0000 mg | Freq: Three times a day (TID) | INTRAMUSCULAR | Status: DC | PRN

## 2011-11-30 MED ORDER — BUPIVACAINE IN DEXTROSE 0.75-8.25 % IT SOLN
INTRATHECAL | Status: DC | PRN
  Administered 2011-11-30: 2 mL via INTRATHECAL

## 2011-11-30 MED ORDER — FENTANYL CITRATE 0.05 MG/ML IJ SOLN
INTRAMUSCULAR | Status: DC | PRN
  Administered 2011-11-30: 20 ug via INTRATHECAL
  Administered 2011-11-30 (×2): 25 ug via INTRAVENOUS
  Administered 2011-11-30: 30 ug via INTRAVENOUS

## 2011-11-30 MED ORDER — CEFAZOLIN SODIUM-DEXTROSE 2-3 GM-% IV SOLR
INTRAVENOUS | Status: AC
  Filled 2011-11-30: qty 50

## 2011-11-30 MED ORDER — LACTATED RINGERS IV SOLN
INTRAVENOUS | Status: DC | PRN
  Administered 2011-11-30 (×3): via INTRAVENOUS

## 2011-11-30 MED ORDER — PHENOL 1.4 % MT LIQD
1.0000 | OROMUCOSAL | Status: DC | PRN
  Filled 2011-11-30: qty 177

## 2011-11-30 MED ORDER — BISACODYL 5 MG PO TBEC: 5.0000 mg | DELAYED_RELEASE_TABLET | Freq: Every day | ORAL | Status: DC | PRN

## 2011-11-30 MED ORDER — METHOCARBAMOL 500 MG PO TABS: 500.0000 mg | ORAL_TABLET | Freq: Four times a day (QID) | ORAL | Status: DC | PRN

## 2011-11-30 MED ORDER — ACETAMINOPHEN 10 MG/ML IV SOLN
INTRAVENOUS | Status: AC
  Filled 2011-11-30: qty 100

## 2011-11-30 MED ORDER — CEFAZOLIN SODIUM 1-5 GM-% IV SOLN
INTRAVENOUS | Status: DC | PRN
  Administered 2011-11-30: 3 g via INTRAVENOUS

## 2011-11-30 MED ORDER — PROPOFOL 10 MG/ML IV EMUL
INTRAVENOUS | Status: AC
  Filled 2011-11-30: qty 20

## 2011-11-30 MED ORDER — FENTANYL CITRATE 0.05 MG/ML IJ SOLN: 25.0000 ug | INTRAMUSCULAR | Status: DC | PRN

## 2011-11-30 MED ORDER — BUPIVACAINE HCL (PF) 0.25 % IJ SOLN
INTRAMUSCULAR | Status: AC
  Filled 2011-11-30: qty 210

## 2011-11-30 MED ORDER — MIDAZOLAM HCL 2 MG/2ML IJ SOLN
1.0000 mg | INTRAMUSCULAR | Status: DC | PRN
  Administered 2011-11-30: 2 mg via INTRAVENOUS

## 2011-11-30 MED ORDER — ALUM & MAG HYDROXIDE-SIMETH 200-200-20 MG/5ML PO SUSP: 30.0000 mL | ORAL | Status: DC | PRN

## 2011-11-30 MED ORDER — DIPHENHYDRAMINE HCL 12.5 MG/5ML PO ELIX
12.5000 mg | ORAL_SOLUTION | ORAL | Status: DC | PRN
  Filled 2011-11-30: qty 10

## 2011-11-30 MED ORDER — METHOCARBAMOL 100 MG/ML IJ SOLN
500.0000 mg | Freq: Once | INTRAVENOUS | Status: DC
  Administered 2011-11-30: 500 mg via INTRAVENOUS
  Filled 2011-11-30: qty 5

## 2011-11-30 MED ORDER — ONDANSETRON HCL 4 MG/2ML IJ SOLN
INTRAMUSCULAR | Status: AC
  Filled 2011-11-30: qty 2

## 2011-11-30 MED ORDER — OXYCODONE HCL 5 MG PO TABS
ORAL_TABLET | ORAL | Status: AC
  Filled 2011-11-30: qty 1

## 2011-11-30 MED ORDER — FENTANYL CITRATE 0.05 MG/ML IJ SOLN
INTRAMUSCULAR | Status: AC
  Filled 2011-11-30: qty 2

## 2011-11-30 MED ORDER — LISINOPRIL-HYDROCHLOROTHIAZIDE 20-25 MG PO TABS: 1.0000 | ORAL_TABLET | Freq: Two times a day (BID) | ORAL | Status: DC

## 2011-11-30 MED ORDER — EPHEDRINE SULFATE 50 MG/ML IJ SOLN
INTRAMUSCULAR | Status: AC
  Filled 2011-11-30: qty 1

## 2011-11-30 MED ORDER — SODIUM CHLORIDE 0.9 % IV SOLN
INTRAVENOUS | Status: DC
  Administered 2011-11-30 – 2011-12-01 (×2): via INTRAVENOUS

## 2011-11-30 MED ORDER — SODIUM CHLORIDE 0.9 % IR SOLN
Status: DC | PRN
  Administered 2011-11-30: 1000 mL

## 2011-11-30 MED ORDER — ONDANSETRON HCL 4 MG/2ML IJ SOLN
4.0000 mg | Freq: Once | INTRAMUSCULAR | Status: AC
  Administered 2011-11-30: 4 mg via INTRAVENOUS

## 2011-11-30 MED ORDER — CELECOXIB 100 MG PO CAPS
400.0000 mg | ORAL_CAPSULE | Freq: Once | ORAL | Status: AC
  Administered 2011-11-30: 400 mg via ORAL

## 2011-11-30 MED ORDER — CEFAZOLIN SODIUM-DEXTROSE 2-3 GM-% IV SOLR
2.0000 g | Freq: Four times a day (QID) | INTRAVENOUS | Status: AC
  Administered 2011-11-30 (×2): 2 g via INTRAVENOUS
  Filled 2011-11-30 (×3): qty 50

## 2011-11-30 MED ORDER — FLEET ENEMA 7-19 GM/118ML RE ENEM: 1.0000 | ENEMA | Freq: Once | RECTAL | Status: AC | PRN

## 2011-11-30 MED ORDER — PHENYLEPHRINE HCL 10 MG/ML IJ SOLN
INTRAMUSCULAR | Status: DC | PRN
  Administered 2011-11-30 (×2): 100 ug via INTRAVENOUS

## 2011-11-30 MED ORDER — CEFAZOLIN SODIUM-DEXTROSE 2-3 GM-% IV SOLR: 2.0000 g | INTRAVENOUS | Status: DC

## 2011-11-30 MED ORDER — HYDROCHLOROTHIAZIDE 25 MG PO TABS
25.0000 mg | ORAL_TABLET | Freq: Every day | ORAL | Status: DC
  Administered 2011-12-01 – 2011-12-03 (×3): 25 mg via ORAL
  Filled 2011-11-30 (×3): qty 1

## 2011-11-30 MED ORDER — LISINOPRIL 10 MG PO TABS
20.0000 mg | ORAL_TABLET | Freq: Every day | ORAL | Status: DC
  Administered 2011-12-01 – 2011-12-03 (×3): 20 mg via ORAL
  Filled 2011-11-30 (×3): qty 2

## 2011-11-30 MED ORDER — METFORMIN HCL 500 MG PO TABS
1000.0000 mg | ORAL_TABLET | Freq: Two times a day (BID) | ORAL | Status: DC
  Administered 2011-11-30 – 2011-12-03 (×6): 1000 mg via ORAL
  Filled 2011-11-30 (×6): qty 2

## 2011-11-30 MED ORDER — OXYCODONE HCL 5 MG PO TABS
5.0000 mg | ORAL_TABLET | ORAL | Status: DC
  Administered 2011-11-30 – 2011-12-01 (×4): 5 mg via ORAL
  Filled 2011-11-30 (×4): qty 1

## 2011-11-30 MED ORDER — CEFAZOLIN SODIUM 1-5 GM-% IV SOLN
1.0000 g | INTRAVENOUS | Status: DC
Start: 2011-11-30 — End: 2011-11-30

## 2011-11-30 MED ORDER — DEXTROSE 5 % IV SOLN
3.0000 g | INTRAVENOUS | Status: DC
  Filled 2011-11-30: qty 3000

## 2011-11-30 MED ORDER — SIMVASTATIN 20 MG PO TABS
20.0000 mg | ORAL_TABLET | Freq: Every evening | ORAL | Status: DC
  Administered 2011-11-30 – 2011-12-02 (×3): 20 mg via ORAL
  Filled 2011-11-30 (×3): qty 1

## 2011-11-30 MED ORDER — DOCUSATE SODIUM 100 MG PO CAPS
100.0000 mg | ORAL_CAPSULE | Freq: Two times a day (BID) | ORAL | Status: DC
  Administered 2011-11-30 – 2011-12-03 (×7): 100 mg via ORAL
  Filled 2011-11-30 (×7): qty 1

## 2011-11-30 MED ORDER — ONDANSETRON HCL 4 MG/2ML IJ SOLN: 4.0000 mg | Freq: Once | INTRAMUSCULAR | Status: DC | PRN

## 2011-11-30 MED ORDER — BUPIVACAINE 0.25 % ON-Q PUMP SINGLE CATH 300ML
INJECTION | Status: DC | PRN
  Administered 2011-11-30: 270 mL

## 2011-11-30 MED ORDER — CEFAZOLIN SODIUM 1-5 GM-% IV SOLN
INTRAVENOUS | Status: AC
  Filled 2011-11-30: qty 50

## 2011-11-30 SURGICAL SUPPLY — 74 items
BAG HAMPER (MISCELLANEOUS) ×2 IMPLANT
BANDAGE ELASTIC 4 VELCRO NS (GAUZE/BANDAGES/DRESSINGS) ×2 IMPLANT
BANDAGE ELASTIC 6 VELCRO NS (GAUZE/BANDAGES/DRESSINGS) ×2 IMPLANT
BANDAGE ESMARK 6X9 LF (GAUZE/BANDAGES/DRESSINGS) ×1 IMPLANT
BIT DRILL 3.2X128 (BIT) IMPLANT
BLADE HEX COATED 2.75 (ELECTRODE) ×2 IMPLANT
BLADE SAG 18X100X1.27 (BLADE) ×2 IMPLANT
BLADE SAGITTAL 25.0X1.27X90 (BLADE) ×2 IMPLANT
BLADE SAW SAG 90X13X1.27 (BLADE) ×2 IMPLANT
BNDG ESMARK 6X9 LF (GAUZE/BANDAGES/DRESSINGS) ×2
BOWL SMART MIX CTS (DISPOSABLE) IMPLANT
CATH KIT ON Q 2.5IN SLV (PAIN MANAGEMENT) ×2 IMPLANT
CEMENT BONE 1-PACK (Cement) ×4 IMPLANT
CLOTH BEACON ORANGE TIMEOUT ST (SAFETY) ×2 IMPLANT
COVER LIGHT HANDLE STERIS (MISCELLANEOUS) ×4 IMPLANT
COVER PROBE W GEL 5X96 (DRAPES) ×2 IMPLANT
CUFF TOURNIQUET SINGLE 34IN LL (TOURNIQUET CUFF) IMPLANT
CUFF TOURNIQUET SINGLE 44IN (TOURNIQUET CUFF) ×2 IMPLANT
DECANTER SPIKE VIAL GLASS SM (MISCELLANEOUS) ×22 IMPLANT
DRAIN TROCAR  MED 1/8 (DRAIN) ×2 IMPLANT
DRAPE BACK TABLE (DRAPES) ×2 IMPLANT
DRAPE EXTREMITY T 121X128X90 (DRAPE) ×2 IMPLANT
DRAPE INCISE IOBAN 44X35 STRL (DRAPES) ×2 IMPLANT
DRAPE U-SHAPE 47X51 STRL (DRAPES) ×2 IMPLANT
DRSG MEPILEX BORDER 4X12 (GAUZE/BANDAGES/DRESSINGS) ×2 IMPLANT
DURAPREP 26ML APPLICATOR (WOUND CARE) ×4 IMPLANT
ELECT REM PT RETURN 9FT ADLT (ELECTROSURGICAL) ×2
ELECTRODE REM PT RTRN 9FT ADLT (ELECTROSURGICAL) ×1 IMPLANT
FACESHIELD LNG OPTICON STERILE (SAFETY) ×2 IMPLANT
GLOVE BIOGEL PI IND STRL 7.0 (GLOVE) ×2 IMPLANT
GLOVE BIOGEL PI IND STRL 8 (GLOVE) ×1 IMPLANT
GLOVE BIOGEL PI IND STRL 8.5 (GLOVE) ×1 IMPLANT
GLOVE BIOGEL PI INDICATOR 7.0 (GLOVE) ×2
GLOVE BIOGEL PI INDICATOR 8 (GLOVE) ×1
GLOVE BIOGEL PI INDICATOR 8.5 (GLOVE) ×1
GLOVE ECLIPSE 6.5 STRL STRAW (GLOVE) ×4 IMPLANT
GLOVE EXAM NITRILE MD LF STRL (GLOVE) ×4 IMPLANT
GLOVE OPTIFIT SS 8.0 STRL (GLOVE) ×2 IMPLANT
GLOVE SKINSENSE NS SZ8.0 LF (GLOVE) ×3
GLOVE SKINSENSE STRL SZ8.0 LF (GLOVE) ×3 IMPLANT
GLOVE SS N UNI LF 8.5 STRL (GLOVE) ×2 IMPLANT
GOWN STRL REIN 3XL LVL4 (GOWN DISPOSABLE) ×2 IMPLANT
GOWN STRL REIN XL XLG (GOWN DISPOSABLE) ×8 IMPLANT
HANDPIECE INTERPULSE COAX TIP (DISPOSABLE) ×1
HOOD W/PEELAWAY (MISCELLANEOUS) ×8 IMPLANT
INST SET MAJOR BONE (KITS) ×2 IMPLANT
IV NS IRRIG 3000ML ARTHROMATIC (IV SOLUTION) ×2 IMPLANT
KIT BLADEGUARD II DBL (SET/KITS/TRAYS/PACK) ×2 IMPLANT
KIT ROOM TURNOVER APOR (KITS) ×2 IMPLANT
MANIFOLD NEPTUNE II (INSTRUMENTS) ×2 IMPLANT
MARKER SKIN DUAL TIP RULER LAB (MISCELLANEOUS) ×2 IMPLANT
NEEDLE HYPO 21X1.5 SAFETY (NEEDLE) ×2 IMPLANT
NS IRRIG 1000ML POUR BTL (IV SOLUTION) ×2 IMPLANT
PACK TOTAL JOINT (CUSTOM PROCEDURE TRAY) ×2 IMPLANT
PAD ARMBOARD 7.5X6 YLW CONV (MISCELLANEOUS) ×2 IMPLANT
PAD DANNIFLEX CPM (ORTHOPEDIC SUPPLIES) ×2 IMPLANT
PIN TROCAR 3 INCH (PIN) ×2 IMPLANT
PUMP ON Q 270MLX5ML/HR (PAIN MANAGEMENT) ×2 IMPLANT
SET BASIN LINEN APH (SET/KITS/TRAYS/PACK) ×2 IMPLANT
SET HNDPC FAN SPRY TIP SCT (DISPOSABLE) ×1 IMPLANT
SPONGE GAUZE 4X4 12PLY (GAUZE/BANDAGES/DRESSINGS) IMPLANT
SPONGE LAP 18X18 X RAY DECT (DISPOSABLE) ×2 IMPLANT
STAPLER VISISTAT 35W (STAPLE) ×2 IMPLANT
SUT BRALON NAB BRD #1 30IN (SUTURE) ×4 IMPLANT
SUT MON AB 0 CT1 (SUTURE) ×2 IMPLANT
SUT MON AB 2-0 CT1 36 (SUTURE) ×2 IMPLANT
SYR 30ML LL (SYRINGE) ×2 IMPLANT
SYR BULB IRRIGATION 50ML (SYRINGE) ×2 IMPLANT
TOWEL OR 17X26 4PK STRL BLUE (TOWEL DISPOSABLE) ×2 IMPLANT
TOWER CARTRIDGE SMART MIX (DISPOSABLE) ×2 IMPLANT
TRAY FOLEY CATH 14FR (SET/KITS/TRAYS/PACK) ×2 IMPLANT
WATER STERILE IRR 1000ML POUR (IV SOLUTION) ×8 IMPLANT
YANKAUER SUCT 12FT TUBE ARGYLE (SUCTIONS) ×2 IMPLANT
YANKAUER SUCT BULB TIP NO VENT (SUCTIONS) ×2 IMPLANT

## 2011-11-30 NOTE — Brief Op Note (Signed)
11/30/2011  10:09 AM  PATIENT:  [name] Price  61 y.o. female  PRE-OPERATIVE DIAGNOSIS:  osteoarthritis right knee  POST-OPERATIVE DIAGNOSIS:  osteoarthritis right knee  PROCEDURE:  Procedure(s) (LRB): TOTAL KNEE ARTHROPLASTY (Right)  SURGEON:  Surgeon(s) and Role:    * Vickki Hearing, MD - Primary  PHYSICIAN ASSISTANT:   ASSISTANTS: ron harris and betty ashley   ANESTHESIA:   spinal  EBL:  Total I/O In: 2000 [I.V.:2000] Out: 120 [Urine:100; Blood:20]  BLOOD ADMINISTERED:none  DRAINS: 1 hemovac drain    LOCAL MEDICATIONS USED:  MARCAINE   , Amount: 60 ml and OTHER epi  SPECIMEN:  No Specimen  DISPOSITION OF SPECIMEN:  N/A  COUNTS:  YES  TOURNIQUET:   Total Tourniquet Time Documented: Thigh (Right) - 97 minutes  DICTATION: .Reubin Milan Dictation  PLAN OF CARE: Admit to inpatient   PATIENT DISPOSITION:  PACU - hemodynamically stable.   Delay start of Pharmacological VTE agent (>24hrs) due to surgical blood loss or risk of bleeding: yes

## 2011-11-30 NOTE — Interval H&P Note (Signed)
History and Physical Interval Note:  11/30/2011 7:21 AM  [name] Price  has presented today for surgery, with the diagnosis of osteoarthritis right knee  The various methods of treatment have been discussed with the patient and family. After consideration of risks, benefits and other options for treatment, the patient has consented to  Procedure(s) (LRB): TOTAL KNEE ARTHROPLASTY (Right) as a surgical intervention .  The patient's history has been reviewed, patient examined, no change in status, stable for surgery.  I have reviewed the patient's chart and labs.  Questions were answered to the patient's satisfaction.     Fuller Canada

## 2011-11-30 NOTE — Anesthesia Procedure Notes (Addendum)
Date/Time: 11/30/2011 7:32 AM Performed by: Carolyne Littles, Kady Toothaker L Pre-anesthesia Checklist: Patient identified, Timeout performed, Emergency Drugs available, Suction available and Patient being monitored Patient Re-evaluated:Patient Re-evaluated prior to inductionOxygen Delivery Method: Non-rebreather mask   Spinal  Patient location during procedure: OR Start time: 11/30/2011 7:57 AM Staffing Anesthesiologist: Laurene Footman CRNA/Resident: ANDRAZA, Yobany Vroom L Preanesthetic Checklist Completed: patient identified, site marked, surgical consent, pre-op evaluation, timeout performed, IV checked, risks and benefits discussed and monitors and equipment checked Spinal Block Patient position: right lateral decubitus Prep: Betadine Patient monitoring: heart rate, cardiac monitor, continuous pulse ox and blood pressure Approach: right paramedian Location: L3-4 Injection technique: single-shot Needle Needle type: Spinocan  Needle gauge: 22 G Needle length: 9 cm Assessment Sensory level: T8 Additional Notes ATTEMPTS:2 (Andraza, CRNA; Spinal placed by Dr. Marcos Eke x1 attempt) Doran Clay ZO:[id number] TRAY EXPIRATION DATE:09/2012  Bupivacaine .75% 2cc, Fentanyl 20 mcg, epi.1 injected intrathecally by Dr. Marcos Eke; patient tolerated well.

## 2011-11-30 NOTE — Transfer of Care (Signed)
Immediate Anesthesia Transfer of Care Note  Patient: [name] Price  Procedure(s) Performed: Procedure(s) (LRB): TOTAL KNEE ARTHROPLASTY (Right)  Patient Location: PACU  Anesthesia Type: Spinal  Level of Consciousness: awake, alert , oriented and patient cooperative  Airway & Oxygen Therapy: Patient Spontanous Breathing  Post-op Assessment: Report given to PACU RN and Post -op Vital signs reviewed and stable  Post vital signs: Reviewed and stable  Complications: No apparent anesthesia complications

## 2011-11-30 NOTE — Op Note (Signed)
11/30/2011  10:09 AM  PATIENT:  [name] Price  61 y.o. female  PRE-OPERATIVE DIAGNOSIS:  osteoarthritis right knee  POST-OPERATIVE DIAGNOSIS:  osteoarthritis right knee  PROCEDURE:  Procedure(s) (LRB): TOTAL KNEE ARTHROPLASTY (Right)  SURGEON:  Surgeon(s) and Role:    * Vickki Hearing, MD - Primary  PHYSICIAN ASSISTANT:   ASSISTANTS: ron harris and betty ashley   ANESTHESIA:   spinal  EBL:  Total I/O In: 2000 [I.V.:2000] Out: 120 [Urine:100; Blood:20]  BLOOD ADMINISTERED:none  DRAINS: 1 hemovac drain    LOCAL MEDICATIONS USED:  MARCAINE   , Amount: 60 ml and OTHER epi  SPECIMEN:  No Specimen  DISPOSITION OF SPECIMEN:  N/A  COUNTS:  YES  TOURNIQUET:   Total Tourniquet Time Documented: Thigh (Right) - 97 minutes  DICTATION: .Reubin Milan Dictation  PLAN OF CARE: Admit to inpatient   PATIENT DISPOSITION:  PACU - hemodynamically stable.   Delay start of Pharmacological VTE agent (>24hrs) due to surgical blood loss or risk of bleeding: yes  Details of procedure  The patient's right knee was confirmed as surgical site marked. A chart review was performed and chart update.  The patient was taken to the operating room for spinal anesthetic. She was given weight appropriate Ancef antibiotic IV. She was placed supine. Tourniquet was placed on the right thigh. Right lower extremity prepped and draped sterilely including the foot.  Timeout procedure executed  The tourniquet was elevated while the limb was prepped. The knee was placed in flexion and a midline incision was made. Incision was taken through the subcutaneous tissue. The extensor mechanism was exposed. A medial arthrotomy was performed. Medial and lateral meniscal tissue was resected. The anterior cruciate ligament and PCL were resected. The arthritic changes were noted on the medial side of the tibia,  Femur, lateral rim of the tibia and patella. Osteophytes were resected with a rongeur.  Femoral  preparation: A drill was used to enter the femoral canal. The canal was irrigated with saline and suctioned until clear. Intramedullary guidewire was placed and set for an 8 mm distal resection. The cutting block was pinned and a saw was used to resect the distal 8 mm of femur. The femur was then sized to a size 4 and a 4-in-1 cutting block was placed. The 4 cuts were made to attempt at cutting block.  Tibial preparation : Attention was then turned to the tibia. The resection was referenced from the higher lateral side referencing and 9 mm resection. A neutral cut was made using external alignment rod.  Tibia measured size 5  A gap Check was then performed. We used a laminar spreader as well as spacer blocks. The knee was balanced in extension with an 11 mm spacer block. Additional medial release was performed. The flexion gap also balanced with an 11 mm spacer block.  We then turned our attention back to the femur to make the box cut lateralize in the femoral prosthesis. We took her blood holes.   Patellar preparation: The patella was skeletonized and it measured 20 mm. It was resected down to a size 14. We used an 8 mm patellar button. This gave a thickness of 22 mm. Drill holes were placed and the trial button fit well.  The tibia was then punched.  The knee was irrigated the bone was dried. The cement was mixed on the back table  The implants were cemented in place and the cement was allowed to cure with the 9 mm insert in place.  An 11 mm insert was placed. The knee was taken through range of motion and found to be stable with 120 of flexion and full extension.  The wounds were irrigated the soft tissues were injected with 60 cc of Marcaine with epinephrine. A Hemovac drain was placed. 2 #1 Bralon sutures in interrupted fashion were used to close the extensor mechanism in flexion.  Subcutaneous tissue was closed with 0 Monocryl and 0 Monocryl over a pain pump catheter  Sterile dressing  was applied  Tourniquet was released  The knee was wrapped with an Ace bandage from toes to groin  Postop plan is for full weightbearing. Aspirin and TED hose and foot pumps for DVT prevention

## 2011-11-30 NOTE — Preoperative (Signed)
Beta Blockers   Reason not to administer Beta Blockers:Not Applicable 

## 2011-11-30 NOTE — Anesthesia Postprocedure Evaluation (Signed)
  Anesthesia Post-op Note  Patient: [name] Price  Procedure(s) Performed: Procedure(s) (LRB): TOTAL KNEE ARTHROPLASTY (Right)  Patient Location: PACU  Anesthesia Type: Spinal  Level of Consciousness: awake, alert , oriented and patient cooperative  Airway and Oxygen Therapy: Patient Spontanous Breathing  Post-op Pain: none  Post-op Assessment: Post-op Vital signs reviewed, Patient's Cardiovascular Status Stable, Respiratory Function Stable, Patent Airway and No signs of Nausea or vomiting  Post-op Vital Signs: Reviewed and stable  Complications: No apparent anesthesia complications

## 2011-11-30 NOTE — Anesthesia Preprocedure Evaluation (Addendum)
Anesthesia Evaluation  Patient identified by MRN, date of birth, ID band Patient awake    Reviewed: Allergy & Precautions, H&P , NPO status , Patient's Chart, lab work & pertinent test results  Airway Mallampati: I      Dental  (+) Edentulous Upper and Edentulous Lower   Pulmonary neg pulmonary ROS,  breath sounds clear to auscultation        Cardiovascular hypertension, Pt. on medications Rhythm:Regular Rate:Normal     Neuro/Psych    GI/Hepatic   Endo/Other  Well Controlled, Type 2, Oral Hypoglycemic Agents  Renal/GU      Musculoskeletal   Abdominal   Peds  Hematology   Anesthesia Other Findings   Reproductive/Obstetrics                           Anesthesia Physical Anesthesia Plan  ASA: III  Anesthesia Plan: Spinal   Post-op Pain Management:    Induction:   Airway Management Planned: Nasal Cannula  Additional Equipment:   Intra-op Plan:   Post-operative Plan:   Informed Consent: I have reviewed the patients History and Physical, chart, labs and discussed the procedure including the risks, benefits and alternatives for the proposed anesthesia with the patient or authorized representative who has indicated his/her understanding and acceptance.     Plan Discussed with:   Anesthesia Plan Comments:         Anesthesia Quick Evaluation

## 2011-12-01 LAB — GLUCOSE, CAPILLARY
Glucose-Capillary: 201 mg/dL — ABNORMAL HIGH (ref 70–99)
Glucose-Capillary: 148 mg/dL — ABNORMAL HIGH (ref 70–99)

## 2011-12-01 LAB — BASIC METABOLIC PANEL
BUN: 12 mg/dL (ref 6–23)
Chloride: 100 mEq/L (ref 96–112)
Creatinine, Ser: 0.8 mg/dL (ref 0.50–1.10)
GFR calc non Af Amer: 77 mL/min — ABNORMAL LOW (ref >90)
Glucose, Bld: 143 mg/dL — ABNORMAL HIGH (ref 70–99)
Potassium: 3.8 mEq/L (ref 3.5–5.1)

## 2011-12-01 LAB — CBC
HCT: 34.5 % — ABNORMAL LOW (ref 36.0–46.0)
Hemoglobin: 10.9 g/dL — ABNORMAL LOW (ref 12.0–15.0)
MCHC: 31.6 g/dL (ref 30.0–36.0)
MCV: 92.7 fL (ref 78.0–100.0)
RDW: 14.1 % (ref 11.5–15.5)

## 2011-12-01 MED ORDER — OXYCODONE HCL 5 MG PO TABS
5.0000 mg | ORAL_TABLET | ORAL | Status: DC | PRN
  Administered 2011-12-01: 5 mg via ORAL
  Filled 2011-12-01 (×2): qty 1

## 2011-12-01 MED ORDER — OXYCODONE-ACETAMINOPHEN 5-325 MG PO TABS
1.0000 | ORAL_TABLET | ORAL | Status: DC
  Administered 2011-12-01 – 2011-12-03 (×11): 1 via ORAL
  Filled 2011-12-01 (×10): qty 1

## 2011-12-01 MED ORDER — ACETAMINOPHEN 10 MG/ML IV SOLN
INTRAVENOUS | Status: AC
  Filled 2011-12-01: qty 100

## 2011-12-01 NOTE — Evaluation (Signed)
Physical Therapy Evaluation Patient Details Name: [name] Price MRN: [id number] DOB: 12-Nov-1950 Today's Date: 12/01/2011 Time: 6213-0865 PT Time Calculation (min): 55 min  PT Assessment / Plan / Recommendation Clinical Impression  Pt was seen for initiation of TKR protocol.  She is very cooperative and well motivated, but morbidly obese.  Knee ROM is 0-70 deg, AA and all ther ex were initiated.  She had extreme difficulty standing from the sittting position  and we had to ensure that all surfaces were elevated.  I am a bit worried that she may not manage well at home initially because of her difficulty lifting her body weight and she has 3 steps into her home with no railing available.  I discussed the possibility of short term SNF and she seemed open to the idea.         PT Assessment  Patient needs continued PT services    Follow Up Recommendations  Home health PT;Skilled nursing facility (to be determined)    Barriers to Discharge Inaccessible home environment son is available at home to assist pt    Equipment Recommendations  Rolling walker with 5" wheels;3 in 1 bedside comode    Recommendations for Other Services OT consult   Frequency 7X/week    Precautions / Restrictions Precautions Precautions: Knee Precaution Booklet Issued: Yes (comment) Precaution Comments: pt did not attend the total joint class Restrictions Weight Bearing Restrictions: No   Pertinent Vitals/Pain       Mobility  Bed Mobility Bed Mobility: Supine to Sit;Sit to Supine Supine to Sit: 4: Min assist;HOB elevated (assist for moving RLE) Sit to Supine: Not Tested (comment) Transfers Transfers: Sit to Stand;Stand to Sit Sit to Stand: 3: Mod assist;From elevated surface;From bed;With upper extremity assist Stand to Sit: 4: Min assist;With upper extremity assist;To chair/3-in-1 Details for Transfer Assistance: transfer sit to stand is extremely difficult for pt...must be on an elevated  surface Ambulation/Gait Ambulation/Gait Assistance: 5: Supervision Ambulation Distance (Feet): 5 Feet Assistive device: Rolling walker Gait Pattern: Step-to pattern;Decreased stance time - right;Decreased hip/knee flexion - right;Antalgic Gait velocity: very slow and labored Stairs: No Wheelchair Mobility Wheelchair Mobility: No    Exercises Total Joint Exercises Ankle Circles/Pumps: AROM;Both;10 reps;Supine Quad Sets: AROM;Both;10 reps;Supine Short Arc Quad: AAROM;Right;10 reps;Supine Heel Slides: AAROM;Right;10 reps;Supine Goniometric ROM: R knee = 0-70 deg   PT Diagnosis: Difficulty walking;Abnormality of gait;Generalized weakness;Acute pain  PT Problem List: Decreased strength;Decreased range of motion;Decreased activity tolerance;Decreased mobility;Decreased knowledge of use of DME;Decreased safety awareness;Decreased knowledge of precautions;Obesity;Pain PT Treatment Interventions: DME instruction;Gait training;Stair training;Functional mobility training;Therapeutic activities;Therapeutic exercise;Patient/family education   PT Goals Acute Rehab PT Goals PT Goal Formulation: With patient Time For Goal Achievement: 12/08/11 Potential to Achieve Goals: Good Pt will go Supine/Side to Sit: with min assist;with HOB 0 degrees PT Goal: Supine/Side to Sit - Progress: Goal set today Pt will go Sit to Supine/Side: with min assist;with HOB 0 degrees PT Goal: Sit to Supine/Side - Progress: Goal set today Pt will go Sit to Stand: with min assist;with upper extremity assist;from elevated surface PT Goal: Sit to Stand - Progress: Goal set today Pt will go Stand to Sit: with supervision;with upper extremity assist;to elevated surface PT Goal: Stand to Sit - Progress: Goal set today Pt will Transfer Bed to Chair/Chair to Bed: with min assist PT Transfer Goal: Bed to Chair/Chair to Bed - Progress: Goal set today Pt will Ambulate: 16 - 50 feet;with supervision;with rolling walker PT Goal:  Ambulate - Progress: Goal set today  Pt will Go Up / Down Stairs: 1-2 stairs;with rolling walker;with mod assist PT Goal: Up/Down Stairs - Progress: Goal set today  Visit Information  Last PT Received On: 12/01/11    Subjective Data  Subjective: my L knee is bad too Patient Stated Goal: return home   Prior Functioning  Home Living Lives With: Spouse;Son (husband works outside the home) Available Help at Discharge: Family Type of Home: Mobile home Home Access: Stairs to enter Secretary/administrator of Steps: 3 Entrance Stairs-Rails: None Home Layout: One level Firefighter: Standard Home Adaptive Equipment: None Prior Function Level of Independence: Independent Able to Take Stairs?: Yes Driving: Yes Vocation: Unemployed Comments: 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: No difficulties    Cognition  Overall Cognitive Status: Appears within functional limits for tasks assessed/performed Arousal/Alertness: Awake/alert Orientation Level: Appears intact for tasks assessed Behavior During Session: [facility] for tasks performed    Extremity/Trunk Assessment Right Lower Extremity Assessment RLE ROM/Strength/Tone: Deficits RLE ROM/Strength/Tone Deficits: ROM of knee is 0-70, AA RLE Sensation: WFL - Light Touch Left Lower Extremity Assessment LLE ROM/Strength/Tone: Within functional levels LLE Sensation: WFL - Light Touch;WFL - Proprioception LLE Coordination: WFL - gross motor Trunk Assessment Trunk Assessment: Normal   Balance Balance Balance Assessed: No (appears to be WNL by functional observation)  End of Session PT - End of Session Equipment Utilized During Treatment: Gait belt Activity Tolerance: Patient tolerated treatment well Patient left: in chair;with call bell/phone within reach  GP     Myrlene Broker L 12/01/2011, 9:34 AM

## 2011-12-01 NOTE — Addendum Note (Signed)
Addendum  created 12/01/11 1109 by Franco Nones, CRNA   Modules edited:Notes Section

## 2011-12-01 NOTE — Clinical Social Work Note (Signed)
CSW received consult for placement. Pt plans to return home at d/c as no SNF benefits through insurance. Verified with [employer] by CSW. CM following.   Derenda Fennel, [place] [phone]

## 2011-12-01 NOTE — Care Management Note (Signed)
    Page 1 of 2   12/03/2011     2:15:35 PM   CARE MANAGEMENT NOTE 12/03/2011  Patient:  [name] Price, [name] Price   Account Number:  [id number]  Date Initiated:  12/01/2011  Documentation initiated by:  Sharrie Rothman  Subjective/Objective Assessment:   Pt admitted from home s/p right total knee. Pt lives with husband and will return home at discharge.     Action/Plan:   CM will arrange HH PT and RN and aide. Will also arrange DME per MD orders.   Anticipated DC Date:  12/03/2011   Anticipated DC Plan:  HOME W HOME HEALTH SERVICES      DC Planning Services  CM consult      Choice offered to / List presented to:          [facility] arranged  HH-1 RN  HH-2 PT  HH-4 NURSE'S AIDE      HH agency  [facility]   Status of service:  Completed, signed off Medicare Important Message given?   (If response is "NO", the following Medicare IM given date fields will be blank) Date Medicare IM given:   Date Additional Medicare IM given:    Discharge Disposition:  HOME W HOME HEALTH SERVICES  Per UR Regulation:    If discussed at Long Length of Stay Meetings, dates discussed:    Comments:  12/03/11 1410 Arlyss Queen, RN BSN CM Pt discharged home with [facility] for RN, PT, and aide. Information sent to [facility] intake and [facility] services will start 12/05/11. Pts DME CPM, BSC, and rolling walker set up by [facility] with [employer] and [employer]. Pt tired of waiting for rolling walker to be delivered and will go by [facility] to buy her own walker. [facility] notified that pt is frustrated and will buy her own walker but to have the other equipment delivered to her home this pm. Pt and pts nurse made aware of delays and reasons and discharge arrangements. 12/01/11 1606 Arlyss Queen, RN BSN CM

## 2011-12-01 NOTE — Progress Notes (Signed)
Subjective: 1 Day Post-Op Procedure(s) (LRB): TOTAL KNEE ARTHROPLASTY (Right) Patient reports pain as mild.    Objective: Vital signs in last 24 hours: Temp:  [97.5 F (36.4 C)-99.7 F (37.6 C)] 97.8 F (36.6 C) (08/20 0618) Pulse Rate:  [78-96] 87  (08/20 0410) Resp:  [12-20] 18  (08/20 0410) BP: (90-138)/(21-75) 117/75 mmHg (08/20 0410) SpO2:  [92 %-100 %] 92 % (08/20 0410) Weight:  [308 lb 10.3 oz (140 kg)-327 lb 13.2 oz (148.7 kg)] 327 lb 13.2 oz (148.7 kg) (08/20 0618)  Intake/Output from previous day: 08/19 0701 - 08/20 0700 In: 4372.7 [P.O.:951; I.V.:3121.7; IV Piggyback:300] Out: 2270 [Urine:2000; Drains:250; Blood:20] Intake/Output this shift:     Basename 12/01/11 0458  HGB 10.9*    Basename 12/01/11 0458  WBC 8.0  RBC 3.72*  HCT 34.5*  PLT 187    Basename 12/01/11 0458  NA 138  K 3.8  CL 100  CO2 30  BUN 12  CREATININE 0.80  GLUCOSE 143*  CALCIUM 9.0   No results found for this basename: LABPT:2,INR:2 in the last 72 hours  Neurologically intact Neurovascular intact Sensation intact distally Intact pulses distally Dorsiflexion/Plantar flexion intact  Assessment/Plan: 1 Day Post-Op Procedure(s) (LRB): TOTAL KNEE ARTHROPLASTY (Right) Advance diet Up with therapy D/C IV fluids  Fuller Canada 12/01/2011, 7:23 AM

## 2011-12-01 NOTE — Evaluation (Signed)
Occupational Therapy Evaluation Patient Details Name: [name] Price MRN: [id number] DOB: [birth year] Today's Date: 12/01/2011 Time: 4098-1191 OT Time Calculation (min): 13 min  OT Assessment / Plan / Recommendation Clinical Impression  Patient is a 61 y/o female s/p Right TKR presenting to acute OT with deficits below. Patient will benefit from acute OT services to increase strength, endurance, ADL performance, and functional transfers.    OT Assessment  Patient needs continued OT Services    Follow Up Recommendations  Home health OT       Equipment Recommendations  3 in 1 bedside comode;Tub/shower bench;Other (comment) (reacher, long handled sponge, sock aid)       Frequency  Min 2X/week    Precautions / Restrictions Precautions Precautions: Knee Restrictions RLE Weight Bearing: Weight bearing as tolerated   Pertinent Vitals/Pain 9/10 pain level in right knee. Patient was medicated prior to therapy.    ADL  Grooming: Simulated;Modified independent Lower Body Bathing: Simulated;+1 Total assistance Where Assessed - Lower Body Bathing: Supine, head of bed up Lower Body Dressing: Simulated;+1 Total assistance Where Assessed - Lower Body Dressing: Supine, head of bed up Transfers/Ambulation Related to ADLs: No transfer performed. patient declined.    OT Diagnosis: Generalized weakness  OT Problem List: Decreased strength;Decreased activity tolerance;Impaired balance (sitting and/or standing);Decreased knowledge of use of DME or AE OT Treatment Interventions: Self-care/ADL training;Therapeutic exercise;Energy conservation;DME and/or AE instruction;Balance training;Patient/family education;Therapeutic activities   OT Goals Acute Rehab OT Goals OT Goal Formulation: With patient Time For Goal Achievement: 12/15/11 Potential to Achieve Goals: Good ADL Goals Pt Will Perform Lower Body Bathing: with min assist;Sit to stand from bed;Supported ADL Goal: Lower Body Bathing -  Progress: Goal set today Pt Will Perform Lower Body Dressing: with min assist;with adaptive equipment;Sit to stand from bed (reacher and sock aid) ADL Goal: Lower Body Dressing - Progress: Goal set today Pt Will Transfer to Toilet: with mod assist;Ambulation;Extra wide 3-in-1;with DME ADL Goal: Toilet Transfer - Progress: Goal set today Pt Will Perform Toileting - Clothing Manipulation: with min assist;Standing ADL Goal: Toileting - Clothing Manipulation - Progress: Goal set today Pt Will Perform Toileting - Hygiene: with min assist;Sit to stand from 3-in-1/toilet ADL Goal: Toileting - Hygiene - Progress: Goal set today Pt Will Perform Tub/Shower Transfer: Tub transfer;Ambulation;with DME;Transfer tub bench ADL Goal: Tub/Shower Transfer - Progress: Goal set today Arm Goals Pt Will Complete Theraband Exer: with supervision, verbal cues required/provided;1 set;10 reps;Bilateral upper extremities;to increase strength Arm Goal: Theraband Exercises - Progress: Goal set today  Visit Information  Last OT Received On: 12/01/11 Assistance Needed: +1    Subjective Data  Subjective: "I did not sleep well last night." Patient Stated Goal: To go home.   Prior Functioning  Vision/Perception  Home Living Lives With: Spouse;Son;Other (Comment) (husband works outside of home.) Available Help at Discharge: Family Type of Home: Mobile home Home Access: Stairs to enter Secretary/administrator of Steps: 3 Entrance Stairs-Rails: None Home Layout: One level Bathroom Shower/Tub: Forensic scientist: Standard Home Adaptive Equipment: None Prior Function Level of Independence: Independent Able to Take Stairs?: Yes Driving: Yes Vocation: Retired Comments: Worked at [employer] up until June 2013. Communication Communication: No difficulties Dominant Hand: Right   Vision - Assessment Eye Alignment: Within Functional Limits  Cognition  Overall Cognitive Status: Appears within  functional limits for tasks assessed/performed Arousal/Alertness: Awake/alert Orientation Level: Appears intact for tasks assessed Behavior During Session: [facility] for tasks performed    Extremity/Trunk Assessment Right Upper Extremity Assessment RUE ROM/Strength/Tone: Deficits  RUE ROM/Strength/Tone Deficits: A/ROM WFL in all ranges. MMT: 4/5 Left Upper Extremity Assessment LUE ROM/Strength/Tone: Deficits LUE ROM/Strength/Tone Deficits: A/ROM WFL in all ranges. MMT: 4-/5            End of Session OT - End of Session Activity Tolerance: Patient limited by fatigue Patient left: in bed;with call bell/phone within reach CPM Right Knee CPM Right Knee: On Right Knee Flexion (Degrees): 60  Right Knee Extension (Degrees): 0     [name] Price, OTR/L 12/01/2011, 2:06 PM

## 2011-12-01 NOTE — Progress Notes (Signed)
UR Chart Review Completed  

## 2011-12-01 NOTE — Progress Notes (Signed)
Physical Therapy Treatment Patient Details Name: [name] Price MRN: [id number] DOB: Jun 17, 1950 Today's Date: 12/01/2011 Time: 6578-4696 PT Time Calculation (min): 52 min  PT Assessment / Plan / Recommendation Comments on Treatment Session  Slight improvement this PM of transfer ability and increased ROM R knee.  ROM is 0-78 deg, AA.  CPM initiated at 0-60 deg.  Pt is planning to return home at d/c as she has no SNF benefits with insurance    Follow Up Recommendations  Home health PT    Barriers to Discharge Inaccessible home environment son is available at home to assist pt    Equipment Recommendations  Rolling walker with 5" wheels;3 in 1 bedside comode    Recommendations for Other Services OT consult  Frequency 7X/week   Plan Frequency remains appropriate;Discharge plan needs to be updated    Precautions / Restrictions Precautions Precautions: Knee Precaution Booklet Issued: Yes (comment) Precaution Comments: pt did not attend the total joint class Restrictions Weight Bearing Restrictions: No   Pertinent Vitals/Pain     Mobility  Bed Mobility Bed Mobility: Supine to Sit;Sit to Supine Supine to Sit: 4: Min assist;HOB elevated (assist for moving RLE) Sit to Supine: 4: Min assist Details for Bed Mobility Assistance: assist of RLE into bed Transfers Transfers: Squat Pivot Transfers;Stand Pivot Transfers Sit to Stand: 3: Mod assist;With upper extremity assist;From chair/3-in-1 Stand to Sit: 4: Min assist;To bed;With upper extremity assist Stand Pivot Transfers: 3: Mod assist Squat Pivot Transfers: Not tested (comment) Details for Transfer Assistance: transfer sit to stand is extremely difficult for pt...must be on an elevated surface Ambulation/Gait Ambulation/Gait Assistance: 5: Supervision Ambulation Distance (Feet): 5 Feet Assistive device: Rolling walker Gait Pattern: Step-to pattern;Decreased stance time - right;Decreased hip/knee flexion - right;Antalgic Gait  velocity: very slow and labored Stairs: No Wheelchair Mobility Wheelchair Mobility: No    Exercises Total Joint Exercises Ankle Circles/Pumps: AROM;Both;10 reps;Supine Quad Sets: AROM;Both;10 reps;Supine Short Arc Quad: AAROM;Right;10 reps;Supine Heel Slides: AAROM;10 reps;Right;Supine Goniometric ROM: R knee 0-78 deg, AA   PT Diagnosis: Difficulty walking;Abnormality of gait;Generalized weakness;Acute pain  PT Problem List: Decreased strength;Decreased range of motion;Decreased activity tolerance;Decreased mobility;Decreased knowledge of use of DME;Decreased safety awareness;Decreased knowledge of precautions;Obesity;Pain PT Treatment Interventions: DME instruction;Gait training;Stair training;Functional mobility training;Therapeutic activities;Therapeutic exercise;Patient/family education   PT Goals Acute Rehab PT Goals PT Goal Formulation: With patient Time For Goal Achievement: 12/08/11 Potential to Achieve Goals: Good Pt will go Supine/Side to Sit: with min assist;with HOB 0 degrees PT Goal: Supine/Side to Sit - Progress: Goal set today Pt will go Sit to Supine/Side: with supervision PT Goal: Sit to Supine/Side - Progress: Updated due to goal met Pt will go Sit to Stand: with min assist;with upper extremity assist;from elevated surface PT Goal: Sit to Stand - Progress: Progressing toward goal Pt will go Stand to Sit: with supervision;with upper extremity assist;to elevated surface PT Goal: Stand to Sit - Progress: Progressing toward goal Pt will Transfer Bed to Chair/Chair to Bed: with min assist PT Transfer Goal: Bed to Chair/Chair to Bed - Progress: Progressing toward goal Pt will Ambulate: 16 - 50 feet;with supervision;with rolling walker PT Goal: Ambulate - Progress: Progressing toward goal Pt will Go Up / Down Stairs: 1-2 stairs;with rolling walker;with mod assist PT Goal: Up/Down Stairs - Progress: Goal set today  Visit Information  Last PT Received On: 12/01/11      Subjective Data  Subjective: no c/o Patient Stated Goal: return home   Cognition  Overall Cognitive Status: Appears within functional  limits for tasks assessed/performed Arousal/Alertness: Awake/alert Orientation Level: Appears intact for tasks assessed Behavior During Session: [facility] for tasks performed    Balance  Balance Balance Assessed: No (appears to be WNL by functional observation)  End of Session PT - End of Session Equipment Utilized During Treatment: Gait belt Activity Tolerance: Patient tolerated treatment well Patient left: in bed;with call bell/phone within reach;in CPM Nurse Communication: Mobility status CPM Right Knee CPM Right Knee: On Right Knee Flexion (Degrees): 60  Right Knee Extension (Degrees): 0    GP     Myrlene Broker L 12/01/2011, 12:45 PM

## 2011-12-01 NOTE — Anesthesia Postprocedure Evaluation (Signed)
Anesthesia Post Note  Patient: [name] Price  Procedure(s) Performed: Procedure(s) (LRB): TOTAL KNEE ARTHROPLASTY (Right)  Anesthesia type: Spinal  Patient location: ICU 7  A215  Post pain: Pain level controlled  Post assessment: Post-op Vital signs reviewed, Patient's Cardiovascular Status Stable, Respiratory Function Stable, Patent Airway, No signs of Nausea or vomiting and Pain level controlled  Last Vitals:  Filed Vitals:   12/01/11 0800  BP: 130/84  Pulse: 96  Temp: 37.6 C  Resp: 18    Post vital signs: Reviewed and stable  Level of consciousness: awake and alert   Complications: No apparent anesthesia complications

## 2011-12-02 ENCOUNTER — Telehealth: Payer: Self-pay | Admitting: Orthopedic Surgery

## 2011-12-02 LAB — TYPE AND SCREEN
Unit division: 0
Unit division: 0

## 2011-12-02 LAB — GLUCOSE, CAPILLARY: Glucose-Capillary: 179 mg/dL — ABNORMAL HIGH (ref 70–99)

## 2011-12-02 LAB — CBC
MCHC: 32.1 g/dL (ref 30.0–36.0)
RDW: 14.1 % (ref 11.5–15.5)

## 2011-12-02 NOTE — Progress Notes (Signed)
Physical Therapy Treatment Patient Details Name: [name] Price MRN: [id number] DOB: 1950-08-11 Today's Date: 12/02/2011 Time: 4098-1191 PT Time Calculation (min): 33 min 30' therex  PT Assessment / Plan / Recommendation Comments on Treatment Session  Pt was able to complete 30' of gait training with RW;Min A. Sit<>stand still requiring elevated surface and Mod A. ROM measurements to be taken afternoon treatment                                       Precautions / Restrictions     Pertinent Vitals/Pain     Mobility  Bed Mobility Supine to Sit: 6: Modified independent (Device/Increase time) Details for Bed Mobility Assistance: required asssistance with RLE to floor Transfers Transfers: Sit to Stand;Stand to Sit Sit to Stand: 3: Mod assist;From bed;From elevated surface;With upper extremity assist Stand to Sit: 4: Min assist Details for Transfer Assistance: Pt requires elevated surface to assist with sit<>stand and assistance controlling descent with stand<>sit Ambulation/Gait Ambulation Distance (Feet): 30 Feet Assistive device: Rolling walker Ambulation/Gait Assistance Details: verbal cues for thoracic extension Gait Pattern: Antalgic;Trunk flexed;Step-to pattern;Decreased stance time - right;Decreased hip/knee flexion - right Gait velocity: slow Stairs: No Wheelchair Mobility Wheelchair Mobility: No    Exercises General Exercises - Lower Extremity Ankle Circles/Pumps: AROM;20 reps Quad Sets: Right;15 reps Short Arc Quad: AAROM Heel Slides: Both;10 reps Other Exercises Other Exercises: seated AAROM HS with 15" hold x 5;to promote flexion   PT Diagnosis:    PT Problem List:   PT Treatment Interventions:     PT Goals    Visit Information  Last PT Received On: 12/02/11    Subjective Data      Cognition       Balance     End of Session PT - End of Session Equipment Utilized During Treatment: Gait belt Activity Tolerance: Patient tolerated treatment  well Patient left: in chair;with call bell/phone within reach   GP     Lenox Ladouceur ATKINSO 12/02/2011, 9:17 AM

## 2011-12-02 NOTE — Progress Notes (Signed)
Called report to [place], RN on Dept 300.  Verbalized understanding.  Pt transferred to 311 via bed.  Schonewitz, Candelaria Stagers 12/02/2011

## 2011-12-02 NOTE — Progress Notes (Signed)
Placed on CPM machine at 1950.  70 degree setting. Tolerating well. Scheduled pain meds given.

## 2011-12-02 NOTE — Progress Notes (Signed)
Physical Therapy Treatment Patient Details Name: [name] Price MRN: [id number] DOB: Apr 19, 1950 Today's Date: 12/02/2011 Time: 4098-1191 PT Time Calculation (min): 47 min  PT Assessment / Plan / Recommendation Comments on Treatment Session  R knee ROM is 0-80 deg, AA.  Pt continues to have difficulty standing from sitting, so she was instructed to ALWAYS sit in an elevated chair.  She was also instructed to have her son by her side as she transfers to standing.  Once up, her gait is stable.  We will not instruct in steps as her husband plans to build a ramp this afternoon.  Her steps have no railing and it would be very difficult to get into her home otherwise    Follow Up Recommendations       Barriers to Discharge        Equipment Recommendations       Recommendations for Other Services    Frequency     Plan Discharge plan remains appropriate;Frequency remains appropriate    Precautions / Restrictions     Pertinent Vitals/Pain     Mobility  Bed Mobility Supine to Sit: 6: Modified independent (Device/Increase time) Sit to Supine: 4: Min assist Details for Bed Mobility Assistance: till needs assist to lift RLE into bed Transfers Transfers: Sit to Stand;Stand to Sit Sit to Stand: 3: Mod assist;From elevated surface;From chair/3-in-1;With upper extremity assist Stand to Sit: 5: Supervision;With upper extremity assist;To bed;To elevated surface Details for Transfer Assistance: Pt requires elevated surface to assist with sit<>stand and assistance controlling descent with stand<>sit Ambulation/Gait Ambulation/Gait Assistance: 5: Supervision Ambulation Distance (Feet): 30 Feet Assistive device: Rolling walker Ambulation/Gait Assistance Details: verbal cues for thoracic extension Gait Pattern: Antalgic;Trunk flexed;Step-to pattern;Decreased stance time - right;Decreased hip/knee flexion - right Gait velocity: slow Stairs: No (husband plans to build ramp this  afternoon) Wheelchair Mobility Wheelchair Mobility: No    Exercises Total Joint Exercises Ankle Circles/Pumps: AROM;Both;10 reps;Supine Quad Sets: AROM;Both;10 reps;Supine Short Arc Quad: AAROM;Right;10 reps;Supine Heel Slides: AAROM;Right;10 reps;Supine General Exercises - Lower Extremity Ankle Circles/Pumps: AROM;20 reps Quad Sets: Right;15 reps Short Arc Quad: AAROM Heel Slides: Both;10 reps Other Exercises Other Exercises: seated AAROM HS with 15" hold x 5;to promote flexion   PT Diagnosis:    PT Problem List:   PT Treatment Interventions:     PT Goals Acute Rehab PT Goals PT Goal: Supine/Side to Sit - Progress: Met PT Goal: Sit to Supine/Side - Progress: Progressing toward goal PT Goal: Sit to Stand - Progress: Progressing toward goal PT Goal: Stand to Sit - Progress: Met PT Goal: Ambulate - Progress: Met PT Goal: Up/Down Stairs - Progress: Discontinued (comment) (husband to build ramp)  Visit Information  Last PT Received On: 12/02/11    Subjective Data  Subjective: no pain   Cognition       Balance     End of Session PT - End of Session Equipment Utilized During Treatment: Gait belt Activity Tolerance: Patient tolerated treatment well Patient left: in bed;with call bell/phone within reach Nurse Communication: Mobility status CPM Right Knee CPM Right Knee: Other (Comment) (will pklace CPM at 0-70 deg after llunch)   GP     [name] Price 12/02/2011, 12:07 PM

## 2011-12-02 NOTE — Telephone Encounter (Addendum)
Received call from Tammy, case manager at [facility] direct ph # [phone], states had spoken with Dr. Romeo Apple also, regarding patient's hospital discharge for tomorrow, 12/03/11.  Requesting letter per [employer] requirement, prior to hospital discharge, in order for insurance to cover her physical therapy, RN home care, home health aide, durable medical equipment including bedside commode, rolling walker, 3 N 1, and CPM machine as per orders. * * Letter has been typed - please review/sign.  Fax # to forward letter:  # [phone]  *12/02/11 continued: Notified Tammy at above contact ph# that the letter was done, signed, and faxed as per request.  *Later this afternoon, 12/02/11, we received a letter, from the sponsor of patient's [employer] plan, [employer], same fax# above, requesting further information.  I called back to Tammy, left voice message, asking for her fax#, as Dr. Romeo Apple states she needs a copy of the letter, for the discharge planner to handle at the hospital.

## 2011-12-02 NOTE — Progress Notes (Signed)
Subjective: 2 Days Post-Op Procedure(s) (LRB): TOTAL KNEE ARTHROPLASTY (Right) Patient reports pain as mild.    Objective: Vital signs in last 24 hours: Temp:  [98.1 F (36.7 C)-99.8 F (37.7 C)] 98.4 F (36.9 C) (08/21 0400) Pulse Rate:  [95-114] 108  (08/21 0400) Resp:  [18-22] 22  (08/21 0400) BP: (130-148)/(79-84) 132/79 mmHg (08/21 0400) SpO2:  [93 %-100 %] 97 % (08/21 0400) Weight:  [320 lb 1.7 oz (145.2 kg)] 320 lb 1.7 oz (145.2 kg) (08/21 0500)  Intake/Output from previous day: 08/20 0701 - 08/21 0700 In: 678.7 [I.V.:678.7] Out: 1270 [Urine:1150; Drains:120] Intake/Output this shift:     Basename 12/02/11 0456 12/01/11 0458  HGB 10.9* 10.9*    Basename 12/02/11 0456 12/01/11 0458  WBC 10.0 8.0  RBC 3.73* 3.72*  HCT 34.0* 34.5*  PLT 175 187    Basename 12/01/11 0458  NA 138  K 3.8  CL 100  CO2 30  BUN 12  CREATININE 0.80  GLUCOSE 143*  CALCIUM 9.0   No results found for this basename: LABPT:2,INR:2 in the last 72 hours  Neurologically intact ABD soft Neurovascular intact Sensation intact distally Intact pulses distally Dorsiflexion/Plantar flexion intact Incision: dressing C/D/I and scant drainage  Assessment/Plan: 2 Days Post-Op Procedure(s) (LRB): TOTAL KNEE ARTHROPLASTY (Right) Discharge home with home health TOMORROW  Fuller Canada 12/02/2011, 7:53 AM

## 2011-12-02 NOTE — Progress Notes (Signed)
Occupational Therapy Treatment Patient Details Name: [name] Price MRN: [id number] DOB: 06-11-1950 Today's Date: 12/02/2011 Time: 3474-2595 OT Time Calculation (min): 25 min  OT Assessment / Plan / Recommendation Comments on Treatment Session A: patient displayed good safety with mobility in room and bathroom even when in small area having to turn for commode transfer.  No LOB at sink with ADL's.  No c/o fatigue while standing.  Patient eager to return home and is going to purchase an AE kit to increase her I with ADL's.                   Plan Discharge plan remains appropriate    Precautions / Restrictions Precautions Precautions: Fall;Knee   Pertinent Vitals/Pain     ADL  Grooming: Performed;Wash/dry hands;Wash/dry face;Teeth care;Brushing hair;Modified independent Where Assessed - Grooming: Unsupported standing Toilet Transfer: Performed;Min guard;Other (comment) (assist to come from sit to stand) Toilet Transfer Method: Other (comment) (ambulated to commode with RW) Toilet Transfer Equipment: Raised toilet seat with arms (or 3-in-1 over toilet) Toileting - Clothing Manipulation and Hygiene: Modified independent Where Assessed - Toileting Clothing Manipulation and Hygiene: Standing Equipment Used: Gait belt;Rolling walker Transfers/Ambulation Related to ADLs: Ambulated to bathroom with CGA and RW    OT Goals Acute Rehab OT Goals OT Goal Formulation: With patient Time For Goal Achievement: 12/15/11 Potential to Achieve Goals: Good ADL Goals Pt Will Perform Lower Body Bathing: with min assist;Sit to stand from bed;Supported ADL Goal: Lower Body Bathing - Progress: Progressing toward goals Pt Will Perform Lower Body Dressing: with min assist;with adaptive equipment;Sit to stand from bed (reacher and sock aid) ADL Goal: Lower Body Dressing - Progress: Progressing toward goals Pt Will Transfer to Toilet: with mod assist;Ambulation;Extra wide 3-in-1;with DME ADL Goal:  Toilet Transfer - Progress: Met Pt Will Perform Toileting - Clothing Manipulation: with min assist;Standing ADL Goal: Toileting - Clothing Manipulation - Progress: Met Pt Will Perform Toileting - Hygiene: with min assist;Sit to stand from 3-in-1/toilet ADL Goal: Toileting - Hygiene - Progress: Met Pt Will Perform Tub/Shower Transfer: Tub transfer;Ambulation;with DME;Transfer tub bench ADL Goal: Tub/Shower Transfer - Progress: Progressing toward goals Arm Goals Pt Will Complete Theraband Exer: with supervision, verbal cues required/provided;1 set;10 reps;Bilateral upper extremities;to increase strength  Visit Information  Last OT Received On: 12/02/11 Assistance Needed: +1    Subjective Data  Subjective: S:  I can't wait to get home   Prior Functioning       Cognition  Overall Cognitive Status: Appears within functional limits for tasks assessed/performed Arousal/Alertness: Awake/alert Orientation Level: Appears intact for tasks assessed Behavior During Session: [facility] for tasks performed    Mobility Bed Mobility Bed Mobility: Rolling Left;Sitting - Scoot to Edge of Bed;Supine to Sit Rolling Left: 4: Min assist;Other (comment) (to manage RLE) Supine to Sit: 4: Min assist;Other (comment) (to manage RLE) Sitting - Scoot to Edge of Bed: 4: Min assist;Other (comment) (to manage RLE) Sit to Supine: 4: Min assist;Other (comment) (to manage RLE) Transfers Sit to Stand: 5: Supervision Stand to Sit: 5: Supervision   Exercises    Balance    End of Session OT - End of Session Equipment Utilized During Treatment: Gait belt Activity Tolerance: Patient tolerated treatment well Patient left: in chair;with call bell/phone within reach  [employer], Dave Mergen L 12/02/2011, 5:40 PM

## 2011-12-02 NOTE — Progress Notes (Signed)
Inpatient Diabetes Program Recommendations  AACE/ADA: New Consensus Statement on Inpatient Glycemic Control (2013)  Target Ranges:  Prepandial:   less than 140 mg/dL      Peak postprandial:   less than 180 mg/dL (1-2 hours)      Critically ill patients:  140 - 180 mg/dL  Results for [name] Price, [name] Price (MRN [id number]) as of 12/02/2011 10:13  Ref. Range 12/01/2011 07:38 12/01/2011 11:36 12/01/2011 16:33 12/01/2011 21:57 12/02/2011 07:35  Glucose-Capillary Latest Range: 70-99 mg/dL 409 (H) 811 (H) 914 (H) 148 (H) 151 (H)    Inpatient Diabetes Program Recommendations Correction (SSI): Start Novolog Moderate scale TID   Thank you  Piedad Climes [facility] Inpatient Diabetes Coordinator [phone]

## 2011-12-03 LAB — CBC
Hemoglobin: 10.4 g/dL — ABNORMAL LOW (ref 12.0–15.0)
MCH: 29.5 pg (ref 26.0–34.0)
MCV: 91.2 fL (ref 78.0–100.0)
RBC: 3.53 MIL/uL — ABNORMAL LOW (ref 3.87–5.11)

## 2011-12-03 LAB — GLUCOSE, CAPILLARY

## 2011-12-03 MED ORDER — ASPIRIN 325 MG PO TBEC: 325.0000 mg | DELAYED_RELEASE_TABLET | Freq: Two times a day (BID) | ORAL | Status: AC

## 2011-12-03 MED ORDER — OXYCODONE-ACETAMINOPHEN 5-325 MG PO TABS: 1.0000 | ORAL_TABLET | ORAL | Status: AC

## 2011-12-03 MED ORDER — BISACODYL 5 MG PO TBEC: 5.0000 mg | DELAYED_RELEASE_TABLET | Freq: Every day | ORAL | Status: AC | PRN

## 2011-12-03 NOTE — Progress Notes (Signed)
Removed from CPM machine. Tolerated well. States very little pain.

## 2011-12-03 NOTE — Progress Notes (Signed)
UR chart review completed.  

## 2011-12-03 NOTE — Progress Notes (Signed)
IV removed, site WNL.  Pt given d/c instructions and new prescriptions.  Discussed home care with patient and discussed home medications, patient verbalizes understanding, teachback completed. F/U appointment in place, pt states they will keep appointment. Dressing changed prior to d/c. Pt is stable at this time. Pt waiting for [facility] equipment to be arranged and then will be d/c home.

## 2011-12-03 NOTE — Telephone Encounter (Signed)
Per call back from Tammy at [facility], they will take care of on the hospital side, per insurance's 3rd party provider for these services.

## 2011-12-03 NOTE — Discharge Summary (Signed)
Physician Discharge Summary  Patient ID: [name] Price MRN: [id number] DOB/AGE: 12-31-1950 61 y.o.  Admit date: 11/30/2011 Discharge date: 12/03/2011  Admission Diagnoses: OA RIGHT KNEE   Discharge Diagnoses: OA RIGHT KNEE  Active Problems:  * No active hospital problems. *    Discharged Condition: good  Hospital Course: UNREMARKABLE WITHOUT COMPLICATION   Consults: None  Significant Diagnostic Studies: labs:  CBC    Component Value Date/Time   WBC 9.4 12/03/2011 0452   RBC 3.53* 12/03/2011 0452   HGB 10.4* 12/03/2011 0452   HCT 32.2* 12/03/2011 0452   PLT 172 12/03/2011 0452   MCV 91.2 12/03/2011 0452   MCH 29.5 12/03/2011 0452   MCHC 32.3 12/03/2011 0452   RDW 14.3 12/03/2011 0452   LYMPHSABS 2.3 11/23/2011 1031   MONOABS 0.3 11/23/2011 1031   EOSABS 0.1 11/23/2011 1031   BASOSABS 0.0 11/23/2011 1031      Treatments: therapies: PT and OT and surgery: RIGHT TKA WITH STRYKER TRIATHALON   Discharge Exam: Blood pressure 118/75, pulse 102, temperature 98.1 F (36.7 C), temperature source Axillary, resp. rate 20, height 5\' 8"  (1.727 m), weight 320 lb 1.7 oz (145.2 kg), SpO2 93.00%. General appearance: alert, cooperative and appears stated age Incision/Wound:CLEAN   Disposition:   Discharge Orders    Future Appointments: Provider: Department: Dept Phone: Center:   12/15/2011 11:00 AM Vickki Hearing, MD [facility] [phone] [facility]     Future Orders Please Complete By Expires   Diet - low sodium heart healthy      Call MD / Call 911      Comments:   If you experience chest pain or shortness of breath, CALL 911 and be transported to the hospital emergency room.  If you develope a fever above 101 F, pus (white drainage) or increased drainage or redness at the wound, or calf pain, call your surgeon's office.   Constipation Prevention      Comments:   Drink plenty of fluids.  Prune juice may be helpful.  You may use a stool softener, such as Colace (over the  counter) 100 mg twice a day.  Use MiraLax (over the counter) for constipation as needed.   Increase activity slowly as tolerated      Discharge instructions      Comments:   No lifting x 12 weeks No driving x 4 weeks No bath x 2 weeks  ECOTRIN 325 TWICE DAILY X 6 WEEKS TED HOSE X 6 WEEKS CPM X 3 WEEKS 0-70 incr 10 degrees per day , for 6-8 hrs per day   Driving restrictions      Comments:   No driving for 4 weeks   Change dressing      Comments:   Change dressing on friday, then change the dressing daily with sterile 4 x 4 inch gauze dressing and apply TED hose.  You may clean the incision with alcohol prior to redressing.     Medication List  As of 12/03/2011  7:25 AM   STOP taking these medications         traMADol-acetaminophen 37.5-325 MG per tablet         TAKE these medications         aspirin 325 MG EC tablet   Take 1 tablet (325 mg total) by mouth 2 (two) times daily.      bisacodyl 5 MG EC tablet   Commonly known as: DULCOLAX   Take 1 tablet (5 mg total) by mouth daily  as needed.      glipiZIDE 5 MG tablet   Commonly known as: GLUCOTROL   Take 5 mg by mouth 2 (two) times daily before a meal.      lisinopril-hydrochlorothiazide 20-25 MG per tablet   Commonly known as: PRINZIDE,ZESTORETIC   Take 1 tablet by mouth 2 (two) times daily.      metFORMIN 500 MG tablet   Commonly known as: GLUCOPHAGE   Take 1,000 mg by mouth 2 (two) times daily with a meal.      oxyCODONE-acetaminophen 5-325 MG per tablet   Commonly known as: PERCOCET/ROXICET   Take 1 tablet by mouth every 4 (four) hours.      simvastatin 20 MG tablet   Commonly known as: ZOCOR   Take 20 mg by mouth every evening.             Signed: Fuller Canada 12/03/2011, 7:25 AM

## 2011-12-04 ENCOUNTER — Encounter (HOSPITAL_COMMUNITY): Payer: Self-pay | Admitting: Orthopedic Surgery

## 2011-12-08 ENCOUNTER — Telehealth: Payer: Self-pay | Admitting: Orthopedic Surgery

## 2011-12-08 NOTE — Telephone Encounter (Signed)
Call received from [facility] care physical therapist, Berline Lopes, who is working with patient on home therapy, following hospital discharge.  Has seen patient for initial visit, and will be doing therapy 3X per week.  The ph# is [phone]; fax to follow for Dr. Romeo Apple to review and sign.

## 2011-12-15 ENCOUNTER — Encounter: Payer: Self-pay | Admitting: Orthopedic Surgery

## 2011-12-15 ENCOUNTER — Ambulatory Visit (INDEPENDENT_AMBULATORY_CARE_PROVIDER_SITE_OTHER): Payer: 59 | Admitting: Orthopedic Surgery

## 2011-12-15 VITALS — BP 126/70 | Ht 68.0 in | Wt 320.0 lb

## 2011-12-15 DIAGNOSIS — Z96659 Presence of unspecified artificial knee joint: Secondary | ICD-10-CM

## 2011-12-15 MED ORDER — OXYCODONE-ACETAMINOPHEN 5-325 MG PO TABS: 1.0000 | ORAL_TABLET | Freq: Four times a day (QID) | ORAL | Status: DC | PRN

## 2011-12-15 NOTE — Patient Instructions (Addendum)
Continue home exercises.

## 2011-12-15 NOTE — Progress Notes (Signed)
Patient ID: [name] Price, female   DOB: [birth year], 61 y.o.   MRN: [id number] Chief Complaint  Patient presents with  . Routine Post Op    post op 1, Rt TKA, DOS 11/30/11    BP 126/70  Ht 5\' 8"  (1.727 m)  Wt 320 lb (145.151 kg)  BMI 48.66 kg/m2  The patient is doing well.  The incision is clean. There are no signs of infection, and staples are removed. The physical therapy notes indicate zero-100 of flexion.  Current pain medication is Percocet 5 mg q.4 h. As needed. Refill was given q.6 as needed #90.  Continue home exercises.  Return in 4 weeks for postop visit. #2.

## 2012-01-12 ENCOUNTER — Encounter: Payer: Self-pay | Admitting: Orthopedic Surgery

## 2012-01-12 ENCOUNTER — Ambulatory Visit (INDEPENDENT_AMBULATORY_CARE_PROVIDER_SITE_OTHER): Payer: 59 | Admitting: Orthopedic Surgery

## 2012-01-12 VITALS — BP 100/60 | Ht 68.0 in | Wt 290.0 lb

## 2012-01-12 DIAGNOSIS — Z96659 Presence of unspecified artificial knee joint: Secondary | ICD-10-CM

## 2012-01-12 MED ORDER — OXYCODONE-ACETAMINOPHEN 5-325 MG PO TABS: 1.0000 | ORAL_TABLET | Freq: Four times a day (QID) | ORAL | Status: DC | PRN

## 2012-01-12 NOTE — Progress Notes (Signed)
Patient ID: [name] Price, female   DOB: 24-May-1950, 61 y.o.   MRN: [id number] Chief Complaint  Patient presents with  . Follow-up    4 week recheck on right knee, DOS 11-30-11.    Ambulates with walker   Knee ROM = 125   INCISION IS CLEAN NO ERYTHEMAL   THERE IS NO JOINT EFFUSION   OUT PX PT   F/U 6 WEEKS   REFILL PAIN MED

## 2012-01-12 NOTE — Patient Instructions (Signed)
Call hospital to arrange PT  

## 2012-01-19 ENCOUNTER — Ambulatory Visit (HOSPITAL_COMMUNITY)
Admission: RE | Admit: 2012-01-19 | Discharge: 2012-01-19 | Disposition: A | Discharge status: Home / Self Care | Payer: 59 | Source: Ambulatory Visit | Attending: Orthopedic Surgery | Admitting: Orthopedic Surgery

## 2012-01-19 DIAGNOSIS — R2689 Other abnormalities of gait and mobility: Secondary | ICD-10-CM | POA: Insufficient documentation

## 2012-01-19 DIAGNOSIS — M6281 Muscle weakness (generalized): Secondary | ICD-10-CM | POA: Insufficient documentation

## 2012-01-19 DIAGNOSIS — R262 Difficulty in walking, not elsewhere classified: Secondary | ICD-10-CM | POA: Insufficient documentation

## 2012-01-19 DIAGNOSIS — R29898 Other symptoms and signs involving the musculoskeletal system: Secondary | ICD-10-CM | POA: Insufficient documentation

## 2012-01-19 DIAGNOSIS — M25569 Pain in unspecified knee: Secondary | ICD-10-CM | POA: Insufficient documentation

## 2012-01-19 DIAGNOSIS — IMO0001 Reserved for inherently not codable concepts without codable children: Secondary | ICD-10-CM | POA: Insufficient documentation

## 2012-01-19 NOTE — Evaluation (Signed)
Physical Therapy Evaluation  Patient Details  Name: [name] Price MRN: [id number] Date of Birth: [birth year]  Today's Date: 01/19/2012 Time: 1030-1108 PT Time Calculation (min): 38 min  Visit#: 1  of 18   Re-eval: 02/18/12 Assessment Diagnosis: R TKR Surgical Date: 11/30/11 Prior Therapy: HH  Authorization: Rosann Auerbach  Past Medical History:  Past Medical History  Diagnosis Date  . Diabetes mellitus   . HTN (hypertension)   . High cholesterol   . Arthritis    Past Surgical History:  Past Surgical History  Procedure Date  . Total knee arthroplasty 11/30/2011    Procedure: TOTAL KNEE ARTHROPLASTY;  Surgeon: Vickki Hearing, MD;  Location: [facility];  Service: Orthopedics;  Laterality: Right;    Subjective Symptoms/Limitations Symptoms: [name] Price states thaat she opted to have her right knee replaced after years of pain.  The pt operation was on 11/30/11, she was discharged from the hospital on 12/03/11 with [facility],  She recieved [facility] therapy and is now being referred to out-patient PT to return pt to her previous functional level.  The patient states that at this time her Left knee is bothering her more than her right knee.  She states that before she  had her total knee replacement she was not using an assistive device but she was walking terrible.  She is currently walking with a walker. Pertinent History: OA in L knee How long can you sit comfortably?: The patient states that she can sit for an hour without difficulty. How long can you stand comfortably?: The patient states she is able to stand for forty minutes.  How long can you walk comfortably?: She is still on her walker; the longest she has walked for has been 5 minutes. Pain Assessment Currently in Pain?: Yes Pain Score:   4 Pain Location: Knee Pain Orientation: Right Pain Type: Surgical pain Pain Onset: More than a month ago Pain Frequency: Intermittent Pain Relieving Factors: pain meds; rest  Multiple Pain Sites:  Yes Home Living Lives With: Family Type of Home: House Home Access: Stairs to enter Secretary/administrator of Steps: 4 Entrance Stairs-Rails: Right Prior Function Driving: Yes Vocation: Full time employment Vocation Requirements:  Theatre stage manager; on feet all day) Leisure: Hobbies-yes (Comment) Comments: reading;   Cognition/Observation Cognition Overall Cognitive Status: Appears within functional limits for tasks assessed  Sensation/Coordination/Flexibility/Functional Tests Functional Tests Functional Tests: LEFS 19/64 ( take out running and hopping activity)  Assessment RLE AROM (degrees) Right Knee Extension: 0  Right Knee Flexion: 130  RLE PROM (degrees) Right Knee Extension: 0  Right Knee Flexion: 130  RLE Strength Right Hip Flexion: 4/5 Right Hip Extension: 3-/5 Right Hip ABduction: 3-/5 Right Hip ADduction: 3/5 Right Knee Flexion: 3/5 Right Knee Extension: 4/5 Right Ankle Dorsiflexion: 4/5  Exercise/Treatments Mobility/Balance  Static Standing Balance Single Leg Stance - Right Leg: 0  Single Leg Stance - Left Leg: 1    Seated Long Arc Quad: Strengthening;Right;10 reps;Limitations Long Arc Quad Limitations: 3# Supine   Sidelying Hip ABduction: AAROM;Right;10 reps Hip ADduction: Strengthening;Right;10 reps Prone  Hamstring Curl: 10 reps;Limitations Hamstring Curl Limitations: 3# Hip Extension: Strengthening;10 reps     Physical Therapy Assessment and Plan PT Assessment and Plan Clinical Impression Statement: Pt S/P TKR on the right knee who demonstrates decreased strength, decreased balance and increased pain which is causing her difficulty in walking.  PT will benefit from skilled PT to address the above deficits and retrun pt to prior functional level. Pt will benefit from skilled therapeutic intervention in  order to improve on the following deficits: Abnormal gait;Decreased activity tolerance;Decreased strength;Difficulty walking;Decreased  balance;Pain Rehab Potential: Good PT Frequency: Min 3X/week PT Duration: 6 weeks PT Treatment/Interventions: Gait training;DME instruction;Therapeutic activities;Therapeutic exercise;Balance training;Manual techniques;Modalities PT Plan: Pt to be gait trained on cane. Begin rocker board, heel raise, mini squat, standing knee flexion and terminal extension next visit.      Goals Home Exercise Program Pt will Perform Home Exercise Program: Independently PT Short Term Goals Time to Complete Short Term Goals: 3 weeks PT Short Term Goal 1: Pt to be able to walk with a cane in the house. PT Short Term Goal 2: Pt to be able to walk for 30 minutes straight. PT Short Term Goal 3: Pt to be able to state pain is no greater than a 4 80 % of the day. PT Long Term Goals Time to Complete Long Term Goals:  (6 weeks) PT Long Term Goal 1: Pt using no assistive device in the house. PT Long Term Goal 2: Pt strength to be increased by one grade. Long Term Goal 3: Pt to be able to walk for over an hour at a time. Long Term Goal 4: Pt to be able to sit for 90 minutes comfortably for traveling or watching a movie. PT Long Term Goal 5: Pt SLS to be 10 seconds. Additional PT Long Term Goals?: Yes PT Long Term Goal 6: Pt LEFS to be increased by 15 points.  Problem List Patient Active Problem List  Diagnosis  . OA (osteoarthritis) of knee  . S/P total knee replacement  . Right leg weakness  . Difficulty walking  . Pain in knee  . Balance disorder    PT - End of Session Activity Tolerance: Patient tolerated treatment well General Behavior During Session: [facility] for tasks performed Cognition: [facility] for tasks performed PT Plan of Care PT Home Exercise Plan: given Consulted and Agree with Plan of Care: Patient  GP    RUSSELL,CINDY 01/19/2012, 12:33 PM  Physician Documentation Your signature is required to indicate approval of the treatment plan as stated above.  Please sign and either send  electronically or make a copy of this report for your files and return this physician signed original.   Please mark one 1.__approve of plan  2. ___approve of plan with the following conditions.   ______________________________                                                          _____________________ Physician Signature                                                                                                             Date

## 2012-01-20 ENCOUNTER — Ambulatory Visit (HOSPITAL_COMMUNITY)
Admission: RE | Admit: 2012-01-20 | Discharge: 2012-01-20 | Disposition: A | Discharge status: Home / Self Care | Payer: 59 | Source: Ambulatory Visit | Attending: Orthopedic Surgery | Admitting: Orthopedic Surgery

## 2012-01-20 NOTE — Progress Notes (Signed)
Physical Therapy Treatment Patient Details  Name: [name] Price MRN: [id number] Date of Birth: 08-04-1950  Today's Date: 01/20/2012 Time: 1102-1205 PT Time Calculation (min): 63 min  Visit#: 2  of 18   Re-eval: 02/18/12  Charge: gait 15', therex 38', ice 10'   Authorization: [employer]   Subjective: Symptoms/Limitations Symptoms: Pt reports HEP compliance. Pt states her L knee is sore but her R knee is not hurting. Pain Assessment Currently in Pain?: Yes Pain Score:   2 Pain Location: Knee Pain Orientation: Left  Objective:   Exercise/Treatments Standing Heel Raises: 10 reps;Limitations Heel Raises Limitations: Toe raises x 10 Knee Flexion: AROM;Right;2 sets;10 reps;Strengthening Terminal Knee Extension: Strengthening;Right;10 reps;Theraband Theraband Level (Terminal Knee Extension): Level 4 (Blue) Rocker Board: 2 minutes;Limitations Rocker Board Limitations: R/L with HHA Gait Training: SPC 500' with cueing for sequencing, educated on reason for L UE with R LE x 15 min Seated Long Arc Quad: Strengthening;Right;15 reps;Weights Long Arc Quad Weight: 3 lbs. Other Seated Knee Exercises: 5 STS with emphasis on control descending   Modalities Modalities: Cryotherapy Cryotherapy Number Minutes Cryotherapy: 10 Minutes Cryotherapy Location: Knee (B knees) Type of Cryotherapy: Ice pack  Physical Therapy Assessment and Plan PT Assessment and Plan Clinical Impression Statement: Began treatment per PT POC for strengthening and to improve gait mechanics with SPC.  Pt required multimodal cueing for proper sequencing with SPC, pt able to demonstrate appropriate 3 point sequencing following cues.  Pt able to stands without hands but recommended to use hands to control descent when sitting due to weakness. PT Plan: Continue with current POC, gait training with SPC try for 2 point sequencing.  Begin seated heel out for hip strengthening and resume mat activities as time allows.     Goals    Problem List Patient Active Problem List  Diagnosis  . OA (osteoarthritis) of knee  . S/P total knee replacement  . Right leg weakness  . Difficulty walking  . Pain in knee  . Balance disorder    PT - End of Session Equipment Utilized During Treatment: Gait belt Activity Tolerance: Patient tolerated treatment well General Behavior During Session: [facility] for tasks performed Cognition: [facility] for tasks performed  GP    Juel Burrow 01/20/2012, 12:14 PM

## 2012-01-22 ENCOUNTER — Ambulatory Visit (HOSPITAL_COMMUNITY)
Admission: RE | Admit: 2012-01-22 | Discharge: 2012-01-22 | Disposition: A | Discharge status: Home / Self Care | Payer: 59 | Source: Ambulatory Visit | Attending: Orthopedic Surgery | Admitting: Orthopedic Surgery

## 2012-01-22 NOTE — Progress Notes (Signed)
Physical Therapy Treatment Patient Details  Name: [name] Price MRN: [id number] Date of Birth: 18-Oct-1950  Today's Date: 01/22/2012 Time: 1105-1205 PT Time Calculation (min): 60 min  Visit#: 3  of 18   Re-eval: 02/18/12    Authorization:    Authorization Time Period:    Authorization Visit#:   of     Subjective: Symptoms/Limitations Symptoms: no complaints right now however patient took pain med 3 hours ago for R knee earlier  Precautions/Restrictions     Exercise/Treatments   Standing Heel Raises: 10 reps Heel Raises Limitations: Toe raises x 10 Knee Flexion: AROM;Right;2 sets;10 reps;Strengthening Terminal Knee Extension: Strengthening;Right;10 reps;Theraband Theraband Level (Terminal Knee Extension): Level 4 (Blue) Forward Step Up: 10 reps;Other (comment) (only TT of RLE to facilitate hip flexion) Rocker Board: 2 minutes Rocker Board Limitations: R/L with HHA Gait Training: SPC 500' with cueing for sequencing, educated on reason for L UE with R LE as well as increasing hip flexion of RLE and avoing hip hike x 15 min with seated rest at halfway and end. Seated Long Arc Quad: Strengthening;Right;15 reps;Weights Long Arc Quad Weight: 3 lbs. Supine   Sidelying Hip ABduction: 15 reps Hip ADduction: Strengthening Prone  Hamstring Curl: 10 reps Hamstring Curl Limitations: 3# Hip Extension: Strengthening      Physical Therapy Assessment and Plan PT Assessment and Plan Clinical Impression Statement: Pt doing well today with strengthening exercises;needing some verbal and tactile cuing for proper technique. During gait training pt still requiring multimodal cueing for proper sequencing and avoid R hip hike by increasing  R hip flexion.. Continue increasing strength of hips and  continue with  PT plan of care.    Goals    Problem List Patient Active Problem List  Diagnosis  . OA (osteoarthritis) of knee  . S/P total knee replacement  . Right leg weakness  .  Difficulty walking  . Pain in knee  . Balance disorder    PT - End of Session Equipment Utilized During Treatment: Gait belt Activity Tolerance: Patient tolerated treatment well General Behavior During Session: [facility] for tasks performed Cognition: [facility] for tasks performed  GP    Janmichael Giraud ATKINSO 01/22/2012, 12:11 PM

## 2012-01-25 ENCOUNTER — Ambulatory Visit (HOSPITAL_COMMUNITY)
Admission: RE | Admit: 2012-01-25 | Discharge: 2012-01-25 | Disposition: A | Discharge status: Home / Self Care | Payer: 59 | Source: Ambulatory Visit | Attending: Orthopedic Surgery | Admitting: Orthopedic Surgery

## 2012-01-25 DIAGNOSIS — R262 Difficulty in walking, not elsewhere classified: Secondary | ICD-10-CM

## 2012-01-25 DIAGNOSIS — R2689 Other abnormalities of gait and mobility: Secondary | ICD-10-CM

## 2012-01-25 DIAGNOSIS — M25569 Pain in unspecified knee: Secondary | ICD-10-CM

## 2012-01-25 DIAGNOSIS — R29898 Other symptoms and signs involving the musculoskeletal system: Secondary | ICD-10-CM

## 2012-01-25 NOTE — Progress Notes (Signed)
Physical Therapy Treatment Patient Details  Name: [name] Price MRN: [id number] Date of Birth: 1950/10/05  Today's Date: 01/25/2012 Time: 8657-8469 PT Time Calculation (min): 48 min  Visit#: 4  of 18   Re-eval: 02/18/12    Authorization: Rosann Auerbach  Authorization Time Period:    Authorization Visit#:   of     Subjective: Symptoms/Limitations Symptoms: Pt states that she overdid over the weekend and her left knee really flared up. Pain Assessment Pain Score:   4 Pain Location: Knee Pain Orientation: Left (Please note we are treating pt for s/p TKR on right knee.)    Exercise/Treatments   Aerobic Tread Mill: .6 x 3'; .8 x 1.30   Seated Long Arc Quad: Strengthening;Right;10 reps;Weights Long Arc Quad Weight: 5 lbs. Other Seated Knee Exercises: sit to stand working on form x 5 Supine Heel Slides: 5 reps Straight Leg Raises: Strengthening;Right;10 reps;Limitations Straight Leg Raises Limitations: 3# Sidelying Hip ABduction: Strengthening;Right;15 reps;Limitations Hip ABduction Limitations: 3# Prone  Hamstring Curl: 15 reps Hamstring Curl Limitations: 3 Hip Extension: Right;10 reps;Limitations Hip Extension Limitations: 3#   Physical Therapy Assessment and Plan PT Assessment and Plan Clinical Impression Statement: Pt complained of increased pain in left knee with WB activity therefore worked mainly on hip strengthening ex in NWB status.  Pt main ambulation limitation is due to  B hip weakness.  Pt urged to do exercises done today on the left LE as well. Rehab Potential: Good PT Plan: continue to strengthen hips begin lateral / forward step ups if able next treatment with 2" step up.    Goals    Problem List Patient Active Problem List  Diagnosis  . OA (osteoarthritis) of knee  . S/P total knee replacement  . Right leg weakness  . Difficulty walking  . Pain in knee  . Balance disorder    PT - End of Session Equipment Utilized During Treatment: Gait  belt Activity Tolerance: Patient tolerated treatment well General Cognition: [facility] for tasks performed  GP    Scott Fix,CINDY 01/25/2012, 12:27 PM

## 2012-01-27 ENCOUNTER — Ambulatory Visit (HOSPITAL_COMMUNITY)
Admission: RE | Admit: 2012-01-27 | Discharge: 2012-01-27 | Disposition: A | Discharge status: Home / Self Care | Payer: 59 | Source: Ambulatory Visit | Attending: Orthopedic Surgery | Admitting: Orthopedic Surgery

## 2012-01-27 NOTE — Progress Notes (Signed)
Physical Therapy Treatment Patient Details  Name: [name] Price MRN: [id number] Date of Birth: 1951/02/24  Today's Date: 01/27/2012 Time: 1100-1155 PT Time Calculation (min): 55 min  Visit#: 5  of 18   Re-eval: 02/18/12 Charges: Therex x 34' Gait x 8' Ice x 10'  Authorization: [employer]    Subjective: Symptoms/Limitations Symptoms: Pt reports decreased pain in L knee. No pain in R knee. Pain Assessment Currently in Pain?: Yes Pain Score:   3 Pain Location: Knee Pain Orientation: Left (Please note we are treating pt for s/p TKR on right knee)   Exercise/Treatments Standing Heel Raises: 10 reps Heel Raises Limitations: Toe raises x 10 Knee Flexion: 10 reps;Right Terminal Knee Extension: 10 reps Theraband Level (Terminal Knee Extension): Level 4 (Blue) Lateral Step Up: 10 reps;Step Height: 4";Right Forward Step Up: 10 reps;Step Height: 4";Right Rocker Board: 2 minutes Rocker Board Limitations: R/L with HHA Gait Training: SPC 125'X2 cueing for sequencing and step-through gait with L LE Seated Long Arc Quad: Strengthening;Right;10 reps;Weights Long Arc Quad Weight: 5 lbs. Other Seated Knee Exercises: sit to stand working on form x 5   Modalities Modalities: Cryotherapy Cryotherapy Number Minutes Cryotherapy: 10 Minutes Cryotherapy Location: Knee (B knees) Type of Cryotherapy: Ice pack  Physical Therapy Assessment and Plan PT Assessment and Plan Clinical Impression Statement: Pt requires multimodal cueing to improve quad control with step ups. Pt presents with improved gait mechanics with SPC after cueing for increased stride length and proper sequencing. Ice applied at end of session to limit pain and swelling. Pt reports pain decrease to 1/10 at end of session.  PT Plan: Continue to progress strength and gait per PT POC.      Problem List Patient Active Problem List  Diagnosis  . OA (osteoarthritis) of knee  . S/P total knee replacement  . Right leg weakness    . Difficulty walking  . Pain in knee  . Balance disorder    PT - End of Session Equipment Utilized During Treatment: Gait belt Activity Tolerance: Patient tolerated treatment well General Behavior During Session: [facility] for tasks performed Cognition: [facility] for tasks performed  Seth Bake, PTA 01/27/2012, 12:18 PM

## 2012-01-29 ENCOUNTER — Ambulatory Visit (HOSPITAL_COMMUNITY)
Admission: RE | Admit: 2012-01-29 | Discharge: 2012-01-29 | Disposition: A | Discharge status: Home / Self Care | Payer: 59 | Source: Ambulatory Visit | Attending: Orthopedic Surgery | Admitting: Orthopedic Surgery

## 2012-01-29 DIAGNOSIS — R2689 Other abnormalities of gait and mobility: Secondary | ICD-10-CM

## 2012-01-29 DIAGNOSIS — R262 Difficulty in walking, not elsewhere classified: Secondary | ICD-10-CM

## 2012-01-29 DIAGNOSIS — R29898 Other symptoms and signs involving the musculoskeletal system: Secondary | ICD-10-CM

## 2012-01-29 DIAGNOSIS — M25569 Pain in unspecified knee: Secondary | ICD-10-CM

## 2012-01-29 NOTE — Progress Notes (Signed)
Physical Therapy Treatment Patient Details  Name: [name] Price MRN: [id number] Date of Birth: 1950/08/20  Today's Date: 01/29/2012 Time: 0865-7846 PT Time Calculation (min): 41 min  Visit#: 6  of 18   Re-eval: 02/18/12    Authorization: [employer]  Subjective: Symptoms/Limitations Symptoms: Pt states that she is sore but no pain. Pain Assessment Currently in Pain?: No/denies   Exercise/Treatments   Standing Heel Raises: 15 reps Lateral Step Up: 10 reps Forward Step Up: 10 reps Rocker Board: 2 minutes Gait Training: SPC x 150 ft. Other Standing Knee Exercises: standing abduction w/4 # B x 10 keeping toes forward. Seated Other Seated Knee Exercises: sit to stand x 5 Supine Bridges: 15 reps Sidelying Hip ABduction: Strengthening;10 reps Hip ABduction Limitations: 4# Prone  Hamstring Curl: 15 reps Hamstring Curl Limitations: 4 Hip Extension: 10 reps Hip Extension Limitations: 4#      Physical Therapy Assessment and Plan PT Assessment and Plan Clinical Impression Statement: Pt needs cuing to keep core engaged so that pt is doing exercises with the correct mm. Pt worked on proper gt sequencing w/ SPC.  PT tends to complete a step to not a step through gt pattern. Pt also needs vc to have equal stride lengths. PT Plan: begin SLS B    Goals    Problem List Patient Active Problem List  Diagnosis  . OA (osteoarthritis) of knee  . S/P total knee replacement  . Right leg weakness  . Difficulty walking  . Pain in knee  . Balance disorder    PT - End of Session Activity Tolerance: Patient tolerated treatment well General Behavior During Session: [facility] for tasks performed Cognition: [facility] for tasks performed  GP    Cyniah Gossard,CINDY 01/29/2012, 3:26 PM

## 2012-02-01 ENCOUNTER — Ambulatory Visit (HOSPITAL_COMMUNITY)
Admission: RE | Admit: 2012-02-01 | Discharge: 2012-02-01 | Disposition: A | Discharge status: Home / Self Care | Payer: 59 | Source: Ambulatory Visit | Attending: Orthopedic Surgery | Admitting: Orthopedic Surgery

## 2012-02-01 ENCOUNTER — Telehealth: Payer: Self-pay | Admitting: Orthopedic Surgery

## 2012-02-01 NOTE — Telephone Encounter (Signed)
[name] Price wants a new Percocet prescription.  Will call [phone] when ready.

## 2012-02-01 NOTE — Progress Notes (Signed)
Physical Therapy Treatment Patient Details  Name: [name] Price MRN: [id number] Date of Birth: 02/03/1951  Today's Date: 02/01/2012 Time: 1110-1154 PT Time Calculation (min): 44 min  Visit#: 7  of 18   Re-eval: 02/18/12 Charges: Therex x 30' Gait x 10'  Authorization: [employer]   Subjective: Symptoms/Limitations Symptoms: I went down the stairs by myself! Pain Assessment Currently in Pain?: No/denies (After pain pill)   Exercise/Treatments Standing Heel Raises: 15 reps Lateral Step Up: 10 reps;Step Height: 4";Right;Hand Hold: 2 (with multimodal cueing to avoid ) Forward Step Up: 10 reps;Step Height: 4";Right;Hand Hold: 2 Stairs: In dept 1 RT Rocker Board: 2 minutes Gait Training: SPC 2 x 150 ft. Seated Other Seated Knee Exercises: sit to stand x 5 w/o UE assist  Physical Therapy Assessment and Plan PT Assessment and Plan Clinical Impression Statement: Pt requires multimodal cueing with therex and gait training to avoid R knee hyper extension. Pt presents with improved gait sequence. Began stair training in dept.to insure safety when ascending/descending stairs at home. PT Plan: Continue to progress per PT POC. Begin B SLS next session.     Problem List Patient Active Problem List  Diagnosis  . OA (osteoarthritis) of knee  . S/P total knee replacement  . Right leg weakness  . Difficulty walking  . Pain in knee  . Balance disorder    PT - End of Session Activity Tolerance: Patient tolerated treatment well General Behavior During Session: [facility] for tasks performed Cognition: [facility] for tasks performed  Seth Bake, PTA 02/01/2012, 12:07 PM

## 2012-02-03 ENCOUNTER — Other Ambulatory Visit: Payer: Self-pay | Admitting: *Deleted

## 2012-02-03 ENCOUNTER — Telehealth: Payer: Self-pay | Admitting: *Deleted

## 2012-02-03 ENCOUNTER — Ambulatory Visit (HOSPITAL_COMMUNITY)
Admission: RE | Admit: 2012-02-03 | Discharge: 2012-02-03 | Disposition: A | Discharge status: Home / Self Care | Payer: 59 | Source: Ambulatory Visit | Attending: Internal Medicine | Admitting: Internal Medicine

## 2012-02-03 ENCOUNTER — Other Ambulatory Visit: Payer: Self-pay | Admitting: Orthopedic Surgery

## 2012-02-03 DIAGNOSIS — Z96659 Presence of unspecified artificial knee joint: Secondary | ICD-10-CM

## 2012-02-03 MED ORDER — HYDROCODONE-ACETAMINOPHEN 10-325 MG PO TABS: 1.0000 | ORAL_TABLET | ORAL | Status: DC | PRN

## 2012-02-03 NOTE — Telephone Encounter (Signed)
boothe send new script to pharmacy

## 2012-02-03 NOTE — Progress Notes (Signed)
Physical Therapy Treatment Patient Details  Name: [name] Price MRN: [id number] Date of Birth: Apr 10, 1951  Today's Date: 02/03/2012 Time: 1103-1152 PT Time Calculation (min): 49 min Visit#: 8  of 18   Re-eval: 02/18/12 Authorization: [employer]  Charges:  therex 40'   Subjective: Symptoms/Limitations Symptoms: Pt. states she is going to get her a SPC today.  Currently without pain, states it feels like it's gonna give away sometimes. Pain Assessment Currently in Pain?: No/denies   Exercise/Treatments Standing Lateral Step Up: 15 reps;Step Height: 4";Hand Hold: 1 Forward Step Up: 15 reps;Step Height: 4";Hand Hold: 1 Stairs: In dept 1 RT Rocker Board: 2 minutes Rocker Board Limitations: R/L with HHA SLS: max 5" L; 2" R X 3 trials each SLS with Vectors: R only 5X5" Gait Training: SPC 1x 150; no AD X 100'. Other Standing Knee Exercises: standing abduction w/4 # B x 10 keeping toes forward. Other Standing Knee Exercises: tandem, retro, sidestepping 1RT each Seated Other Seated Knee Exercises: sit to stand x 5 w/o UE assist   Physical Therapy Assessment and Plan PT Assessment and Plan Clinical Impression Statement: Added balance activities to POC to work on increasing stability and hip strength.  Pt. with extreme substitution of Upper body with ambulation.  Very diffiucult for pt. to control/correct.   Added SLS and vector stance to help increase hip stability.  Progress toward outdoor ambulation with SPC/stairwell training. PT Plan: Continue to progress per PT POC..     Problem List Patient Active Problem List  Diagnosis  . OA (osteoarthritis) of knee  . S/P total knee replacement  . Right leg weakness  . Difficulty walking  . Pain in knee  . Balance disorder    PT - End of Session Activity Tolerance: Patient tolerated treatment well General Behavior During Session: [facility] for tasks performed Cognition: [facility] for tasks performed   Lurena Nida, PTA/CLT 02/03/2012,  12:11 PM

## 2012-02-03 NOTE — Telephone Encounter (Signed)
Patient aware new med sent.

## 2012-02-03 NOTE — Telephone Encounter (Signed)
Jaime,did you get this?  Is he changing the pain medicine?

## 2012-02-04 NOTE — Telephone Encounter (Signed)
Marijean Niemann said she called the patient

## 2012-02-05 ENCOUNTER — Ambulatory Visit (HOSPITAL_COMMUNITY)
Admission: RE | Admit: 2012-02-05 | Discharge: 2012-02-05 | Disposition: A | Discharge status: Home / Self Care | Payer: 59 | Source: Ambulatory Visit | Attending: Orthopedic Surgery | Admitting: Orthopedic Surgery

## 2012-02-05 DIAGNOSIS — M25569 Pain in unspecified knee: Secondary | ICD-10-CM

## 2012-02-05 DIAGNOSIS — R29898 Other symptoms and signs involving the musculoskeletal system: Secondary | ICD-10-CM

## 2012-02-05 DIAGNOSIS — R2689 Other abnormalities of gait and mobility: Secondary | ICD-10-CM

## 2012-02-05 DIAGNOSIS — R262 Difficulty in walking, not elsewhere classified: Secondary | ICD-10-CM

## 2012-02-05 NOTE — Progress Notes (Signed)
Physical Therapy Treatment Patient Details  Name: [name] Price MRN: [id number] Date of Birth: 05/12/1950  Today's Date: 02/05/2012 Time: 1110-1208 PT Time Calculation (min): 58 min  Visit#: 9  of 18   Re-eval: 02/18/12    Authorization: Rosann Auerbach   Subjective: Symptoms/Limitations Symptoms: Pt states that she fell Wed. only.   Pain Assessment Currently in Pain?: No/denies Pain Score:  (more soreness than anything.)     Exercise/Treatments   Standing Heel Raises: 15 reps Functional Squat: 15 reps Gait Training: SPC 1x 150; no AD X 100'. Other Standing Knee Exercises: standing extension, abduction  x 10 keeping toes forward. Other Standing Knee Exercises: tandem, retro, sidestepping 2RT each Seated Long Arc Quad: Right;15 reps;Weights Long Arc Quad Weight: 5 lbs. Other Seated Knee Exercises: sit to stand x 5 w/o UE assist Supine Bridges: 15 reps Straight Leg Raises: 15 reps Sidelying Hip ABduction: Strengthening;Right;15 reps  Physical Therapy Assessment and Plan PT Assessment and Plan Clinical Impression Statement: Pt needs constant verbal cuing to keep hips in neutral position as opposed to ER.  Pt sore today from fall so weight only used with LAQ. Rehab Potential: Good PT Plan: add weights back into program... as well as SLS and lateral step ups.    Goals    Problem List Patient Active Problem List  Diagnosis  . OA (osteoarthritis) of knee  . S/P total knee replacement  . Right leg weakness  . Difficulty walking  . Pain in knee  . Balance disorder       GP    Zhamir Pirro,[name] Price 02/05/2012, 12:30 PM

## 2012-02-08 ENCOUNTER — Ambulatory Visit (HOSPITAL_COMMUNITY)
Admission: RE | Admit: 2012-02-08 | Discharge: 2012-02-08 | Disposition: A | Discharge status: Home / Self Care | Payer: 59 | Source: Ambulatory Visit | Attending: Orthopedic Surgery | Admitting: Orthopedic Surgery

## 2012-02-08 NOTE — Progress Notes (Signed)
Physical Therapy Treatment Patient Details  Name: [name] Price MRN: [id number] Date of Birth: 1950-11-28  Today's Date: 02/08/2012 Time: 7829-5621 PT Time Calculation (min): 49 min Visit#: 10  of 18   Re-eval: 02/18/12 Authorization: [employer]ann Auerbach  Charges:  therex 34', NMR 12'  Subjective: Symptoms/Limitations Symptoms: Pt. states her soreness is about gone from her fall on Wednesday.  States she slipped up on rugs in her bathroom.  Pt. advised to remove rugs from her restroom.  Reports she had a tough time getting out of the floor when she fell. Pain Assessment Currently in Pain?: Yes Pain Score:   4 Pain Location: Knee Pain Orientation: Left   Exercise/Treatments Standing Heel Raises: 15 reps Lateral Step Up: 15 reps;Step Height: 4";Hand Hold: 1 Forward Step Up: 15 reps;Step Height: 4";Hand Hold: 1 Gait Training: no AD during session Other Standing Knee Exercises: standing B extension, abduction  x 10 keeping toes forward. Other Standing Knee Exercises: tandem, retro, sidestepping 2RT each Seated Long Arc Quad: Right;20 reps Long Arc Quad Weight: 5 lbs. Other Seated Knee Exercises: sit to stand x 5 w/o UE assist Supine Bridges: 15 reps Straight Leg Raises: 15 reps Sidelying Hip ABduction: 2 sets;10 reps;Limitations Hip ABduction Limitations: 5# Right    Physical Therapy Assessment and Plan PT Assessment and Plan Clinical Impression Statement: tactile/VC's needed to keep LE in correct alignment for hip abduction; Pt. ambulating with SPC full time now; encouraged again to remove all throw rugs from home to prevent falls. PT Plan: Continue per POC; work on floor transfers.     Problem List Patient Active Problem List  Diagnosis  . OA (osteoarthritis) of knee  . S/P total knee replacement  . Right leg weakness  . Difficulty walking  . Pain in knee  . Balance disorder      Lurena Nida, PTA/CLT 02/08/2012, 12:14 PM

## 2012-02-10 ENCOUNTER — Ambulatory Visit (HOSPITAL_COMMUNITY)
Admission: RE | Admit: 2012-02-10 | Discharge: 2012-02-10 | Disposition: A | Discharge status: Home / Self Care | Payer: 59 | Source: Ambulatory Visit | Attending: Orthopedic Surgery | Admitting: Orthopedic Surgery

## 2012-02-10 NOTE — Progress Notes (Signed)
Physical Therapy Treatment Patient Details  Name: [name] Price MRN: [id number] Date of Birth: 1950-06-02  Today's Date: 02/10/2012 Time: 1100-1154 PT Time Calculation (min): 54 min Charges:  therex 45' Visit#: 11  of 18   Re-eval: 02/18/12 Authorization: Rosann Auerbach    Subjective: Symptoms/Limitations Symptoms: Pt. states she didn't rest well last night.  States she currently has no pain but her R leg feels like it doesn't want to bend. Pain Assessment Currently in Pain?: No/denies   Exercise/Treatments Aerobic Tread Mill: 5' @ 1. Standing Heel Raises: 15 reps Lateral Step Up: 15 reps;Step Height: 4";Hand Hold: 1 Forward Step Up: 15 reps;Step Height: 4";Hand Hold: 1 Gait Training: no AD during session Other Standing Knee Exercises: standing B extension, abduction  x 10 keeping toes forward. Other Standing Knee Exercises: tandem, retro, sidestepping 2RT each Seated Long Arc Quad: Right;20 reps Long Arc Quad Weight: 5 lbs. Other Seated Knee Exercises: sit to stand x 5 w/o UE assist Supine Bridges: 20 reps Straight Leg Raises: 20 reps;Right Other Supine Knee Exercises: floor transfers 2X Sidelying Hip ABduction: 20 reps     Physical Therapy Assessment and Plan PT Assessment and Plan Clinical Impression Statement: Worked on floor transfers and pt. able to complete modified independent by getting into kneeling position, bringing 1 LE around and using outside structure to push from.  Began treadmill to decrease gait deviations with manual faciliation to keep upper body from swaying, increasing stride length and decreasing forward bent posture. PT Plan: Continue per POC; progress strength and decrease gait deviations.     Problem List Patient Active Problem List  Diagnosis  . OA (osteoarthritis) of knee  . S/P total knee replacement  . Right leg weakness  . Difficulty walking  . Pain in knee  . Balance disorder    PT - End of Session Equipment Utilized During  Treatment: Gait belt Activity Tolerance: Patient tolerated treatment well General Behavior During Session: [facility] for tasks performed Cognition: [facility] for tasks performed   Lurena Nida, PTA/CLT 02/10/2012, 12:25 PM

## 2012-02-12 ENCOUNTER — Ambulatory Visit (HOSPITAL_COMMUNITY)
Admission: RE | Admit: 2012-02-12 | Discharge: 2012-02-12 | Disposition: A | Discharge status: Home / Self Care | Payer: 59 | Source: Ambulatory Visit | Attending: Internal Medicine | Admitting: Internal Medicine

## 2012-02-12 DIAGNOSIS — M6281 Muscle weakness (generalized): Secondary | ICD-10-CM | POA: Insufficient documentation

## 2012-02-12 DIAGNOSIS — M25569 Pain in unspecified knee: Secondary | ICD-10-CM | POA: Insufficient documentation

## 2012-02-12 DIAGNOSIS — R262 Difficulty in walking, not elsewhere classified: Secondary | ICD-10-CM | POA: Insufficient documentation

## 2012-02-12 DIAGNOSIS — R2689 Other abnormalities of gait and mobility: Secondary | ICD-10-CM

## 2012-02-12 DIAGNOSIS — IMO0001 Reserved for inherently not codable concepts without codable children: Secondary | ICD-10-CM | POA: Insufficient documentation

## 2012-02-12 DIAGNOSIS — R29898 Other symptoms and signs involving the musculoskeletal system: Secondary | ICD-10-CM

## 2012-02-12 NOTE — Progress Notes (Signed)
Physical Therapy Treatment Patient Details  Name: NAYELIE GRABIEC MRN: [id number] Date of Birth: December 05, 1950  Today's Date: 02/12/2012 Time: 4098-1191 PT Time Calculation (min): 44 min  Visit#: 12  of 18   Re-eval: 02/18/12    Authorization:    Authorization Time Period:    Authorization Visit#:   of     Subjective: Symptoms/Limitations Symptoms: Both my knees are hurting today Pain Assessment Currently in Pain?: Yes Pain Score:   4 Pain Location: Knee Pain Orientation: Right;Left Pain Type: Other (Comment)  Precautions/Restrictions     Exercise/Treatments   Standing Heel Raises: 15 reps Lateral Step Up: 15 reps;Step Height: 4";Hand Hold: 1 Forward Step Up: 15 reps;Step Height: 4";Hand Hold: 1 Other Standing Knee Exercises: standing B extension, abduction  x 10 keeping toes forward. Other Standing Knee Exercises: tandem, retro, sidestepping 2RT each Seated Other Seated Knee Exercises: sit to stand x 15 w/o UE assist Supine Bridges: 20 reps Straight Leg Raises: 20 reps;Right Other Supine Knee Exercises: floor transfers 2X Sidelying Hip ABduction: 10 reps;Limitations Hip ABduction Limitations: 4# wt on knee not ankle Prone  Hip Extension: Strengthening;Right Hip Extension Limitations: 4    Physical Therapy Assessment and Plan PT Assessment and Plan Clinical Impression Statement: Pt right knee mm fatiguing at end of treatment.  Pt required verbal and tactile cuing for engaging core with sit to stand and step ups as pt tends to rotate shoulders to the L when sit to stand or stand to sit.   PT Plan: Pt to be reevaluated next treatment.    Goals    Problem List Patient Active Problem List  Diagnosis  . OA (osteoarthritis) of knee  . S/P total knee replacement  . Right leg weakness  . Difficulty walking  . Pain in knee  . Balance disorder    PT - End of Session Equipment Utilized During Treatment: Gait belt Activity Tolerance: Patient tolerated  treatment well General Behavior During Session: [facility] for tasks performed  GP    RUSSELL,CINDY 02/12/2012, 12:05 PM

## 2012-02-23 ENCOUNTER — Ambulatory Visit (INDEPENDENT_AMBULATORY_CARE_PROVIDER_SITE_OTHER): Payer: 59 | Admitting: Orthopedic Surgery

## 2012-02-23 ENCOUNTER — Encounter: Payer: Self-pay | Admitting: Orthopedic Surgery

## 2012-02-23 DIAGNOSIS — M1710 Unilateral primary osteoarthritis, unspecified knee: Secondary | ICD-10-CM

## 2012-02-23 DIAGNOSIS — Z96659 Presence of unspecified artificial knee joint: Secondary | ICD-10-CM

## 2012-02-23 NOTE — Progress Notes (Signed)
Patient ID: [name] Price, female   DOB: [birth year], 60 y.o.   MRN: [id number] Chief Complaint  Patient presents with  . Follow-up    6 week recheck on right knee.post op tka 11-30-2011    Pain mild   Cane as needed   Flexion 115  , extension is full  Incision is clean   Doing well   F/u @ 1 yr for annual xrays   Implant used [employer]

## 2012-02-23 NOTE — Patient Instructions (Addendum)
activities as tolerated  Cane as needed

## 2012-03-24 ENCOUNTER — Other Ambulatory Visit: Payer: Self-pay | Admitting: *Deleted

## 2012-03-24 DIAGNOSIS — Z96659 Presence of unspecified artificial knee joint: Secondary | ICD-10-CM

## 2012-03-24 MED ORDER — HYDROCODONE-ACETAMINOPHEN 7.5-325 MG PO TABS: 1.0000 | ORAL_TABLET | Freq: Four times a day (QID) | ORAL | Status: DC | PRN

## 2012-04-22 ENCOUNTER — Telehealth: Payer: Self-pay

## 2012-04-22 NOTE — Telephone Encounter (Signed)
Called pt to triage. She wants to call her insurance co. First and will try to call me next week.

## 2012-04-26 NOTE — Telephone Encounter (Signed)
LMOM to call.

## 2012-05-18 NOTE — Telephone Encounter (Signed)
Pt is going to get her husband to check out the insurance and she will call me when she is ready to schedule.

## 2012-05-23 ENCOUNTER — Observation Stay (HOSPITAL_COMMUNITY)
Admission: EM | Admit: 2012-05-23 | Discharge: 2012-05-24 | Disposition: A | Discharge status: Home / Self Care | Payer: 59 | Attending: Internal Medicine | Admitting: Internal Medicine

## 2012-05-23 ENCOUNTER — Encounter (HOSPITAL_COMMUNITY): Payer: Self-pay

## 2012-05-23 ENCOUNTER — Emergency Department (HOSPITAL_COMMUNITY): Payer: 59

## 2012-05-23 DIAGNOSIS — R079 Chest pain, unspecified: Principal | ICD-10-CM | POA: Insufficient documentation

## 2012-05-23 DIAGNOSIS — E119 Type 2 diabetes mellitus without complications: Secondary | ICD-10-CM | POA: Insufficient documentation

## 2012-05-23 DIAGNOSIS — M1710 Unilateral primary osteoarthritis, unspecified knee: Secondary | ICD-10-CM | POA: Insufficient documentation

## 2012-05-23 DIAGNOSIS — I10 Essential (primary) hypertension: Secondary | ICD-10-CM | POA: Insufficient documentation

## 2012-05-23 DIAGNOSIS — Z96659 Presence of unspecified artificial knee joint: Secondary | ICD-10-CM | POA: Insufficient documentation

## 2012-05-23 DIAGNOSIS — E7800 Pure hypercholesterolemia, unspecified: Secondary | ICD-10-CM | POA: Insufficient documentation

## 2012-05-23 LAB — CBC WITH DIFFERENTIAL/PLATELET
Basophils Absolute: 0 10*3/uL (ref 0.0–0.1)
Eosinophils Relative: 2 % (ref 0–5)
HCT: 38.9 % (ref 36.0–46.0)
Hemoglobin: 12.1 g/dL (ref 12.0–15.0)
Lymphocytes Relative: 28 % (ref 12–46)
Lymphs Abs: 2.1 10*3/uL (ref 0.7–4.0)
MCV: 91.5 fL (ref 78.0–100.0)
Monocytes Absolute: 0.3 10*3/uL (ref 0.1–1.0)
Monocytes Relative: 5 % (ref 3–12)
Neutro Abs: 4.7 10*3/uL (ref 1.7–7.7)
RBC: 4.25 MIL/uL (ref 3.87–5.11)
WBC: 7.3 10*3/uL (ref 4.0–10.5)

## 2012-05-23 LAB — COMPREHENSIVE METABOLIC PANEL
AST: 9 U/L (ref 0–37)
CO2: 32 mEq/L (ref 19–32)
Chloride: 98 mEq/L (ref 96–112)
Creatinine, Ser: 0.83 mg/dL (ref 0.50–1.10)
GFR calc Af Amer: 86 mL/min — ABNORMAL LOW (ref >90)
GFR calc non Af Amer: 74 mL/min — ABNORMAL LOW (ref >90)
Glucose, Bld: 122 mg/dL — ABNORMAL HIGH (ref 70–99)
Total Bilirubin: 0.2 mg/dL — ABNORMAL LOW (ref 0.3–1.2)

## 2012-05-23 LAB — TROPONIN I
Troponin I: <0.3 ng/mL (ref <0.30)
Troponin I: <0.3 ng/mL (ref <0.30)

## 2012-05-23 LAB — URINALYSIS, ROUTINE W REFLEX MICROSCOPIC
Glucose, UA: NEGATIVE mg/dL
Hgb urine dipstick: NEGATIVE
Ketones, ur: NEGATIVE mg/dL
Leukocytes, UA: NEGATIVE
Protein, ur: NEGATIVE mg/dL
Urobilinogen, UA: 0.2 mg/dL (ref 0.0–1.0)

## 2012-05-23 MED ORDER — ASPIRIN 81 MG PO CHEW
324.0000 mg | CHEWABLE_TABLET | Freq: Once | ORAL | Status: AC
  Administered 2012-05-23: 324 mg via ORAL
  Filled 2012-05-23: qty 4

## 2012-05-23 MED ORDER — LISINOPRIL-HYDROCHLOROTHIAZIDE 20-25 MG PO TABS: 1.0000 | ORAL_TABLET | Freq: Two times a day (BID) | ORAL | Status: DC

## 2012-05-23 MED ORDER — ASPIRIN EC 81 MG PO TBEC
81.0000 mg | DELAYED_RELEASE_TABLET | Freq: Once | ORAL | Status: AC
  Administered 2012-05-23: 81 mg via ORAL
  Filled 2012-05-23: qty 1

## 2012-05-23 MED ORDER — HYDROCODONE-ACETAMINOPHEN 7.5-325 MG PO TABS: 1.0000 | ORAL_TABLET | Freq: Four times a day (QID) | ORAL | Status: DC | PRN

## 2012-05-23 MED ORDER — SODIUM CHLORIDE 0.9 % IV SOLN
INTRAVENOUS | Status: AC
  Administered 2012-05-23: 12:00:00 via INTRAVENOUS

## 2012-05-23 MED ORDER — METFORMIN HCL 500 MG PO TABS
1000.0000 mg | ORAL_TABLET | Freq: Two times a day (BID) | ORAL | Status: DC
  Administered 2012-05-23 – 2012-05-24 (×2): 1000 mg via ORAL
  Filled 2012-05-23 (×2): qty 2

## 2012-05-23 MED ORDER — ONDANSETRON HCL 4 MG/2ML IJ SOLN: 4.0000 mg | Freq: Three times a day (TID) | INTRAMUSCULAR | Status: AC | PRN

## 2012-05-23 MED ORDER — SIMVASTATIN 20 MG PO TABS
20.0000 mg | ORAL_TABLET | Freq: Every evening | ORAL | Status: DC
  Administered 2012-05-23: 20 mg via ORAL
  Filled 2012-05-23: qty 1

## 2012-05-23 MED ORDER — HYDROCHLOROTHIAZIDE 25 MG PO TABS
25.0000 mg | ORAL_TABLET | Freq: Two times a day (BID) | ORAL | Status: DC
  Administered 2012-05-23 – 2012-05-24 (×2): 25 mg via ORAL
  Filled 2012-05-23 (×2): qty 1

## 2012-05-23 MED ORDER — LISINOPRIL 10 MG PO TABS
20.0000 mg | ORAL_TABLET | Freq: Two times a day (BID) | ORAL | Status: DC
  Administered 2012-05-23 – 2012-05-24 (×2): 20 mg via ORAL
  Filled 2012-05-23 (×3): qty 2

## 2012-05-23 MED ORDER — GLIPIZIDE 5 MG PO TABS
5.0000 mg | ORAL_TABLET | Freq: Two times a day (BID) | ORAL | Status: DC
  Administered 2012-05-23 – 2012-05-24 (×2): 5 mg via ORAL
  Filled 2012-05-23 (×2): qty 1

## 2012-05-23 MED ORDER — ENOXAPARIN SODIUM 60 MG/0.6ML ~~LOC~~ SOLN
60.0000 mg | SUBCUTANEOUS | Status: DC
  Administered 2012-05-23: 60 mg via SUBCUTANEOUS
  Filled 2012-05-23: qty 0.6

## 2012-05-23 NOTE — Telephone Encounter (Signed)
LMOM to call.

## 2012-05-23 NOTE — ED Provider Notes (Signed)
History  This chart was scribed for Glynn Octave, MD by Bennett Scrape, ED Scribe. This patient was seen in room APA02/APA02 and the patient's care was started at 10:08 AM.  CSN: [id number]  Arrival date & time 05/23/12  0954   First MD Initiated Contact with Patient 05/23/12 1008      Chief Complaint  Patient presents with  . Chest Pain     The history is provided by the patient. No language interpreter was used.    [name] Price is a 62 y.o. female who presents to the Emergency Department complaining of 30 to 45 minutes of sudden onset, gradually worsening, constant CP under the left breast that radiates down her left arm that started while she was resting that occurred PTA. She denies having any CP currently. She reports taking 2 baby ASAs with improvement in her symptoms. She denies having prior episodes of similar symptoms. She states that she has prior episodes of indigestion but denies similarities. She denies having a h/o heart or lung problems. She denies having any prior stress test or cardiac catheterizations. She denies being on any blood thinners currently.  She denies SOB, diaphoresis, nausea, back pain, urinary symptoms and rash as associated symptoms. She has a h/o HTN, DM and HLD and takes daily medications for each. She denies smoking and alcohol use.  PCP is Dr. Felecia Shelling.  Past Medical History  Diagnosis Date  . Diabetes mellitus   . HTN (hypertension)   . High cholesterol   . Arthritis     Past Surgical History  Procedure Laterality Date  . Total knee arthroplasty  11/30/2011    Procedure: TOTAL KNEE ARTHROPLASTY;  Surgeon: Vickki Hearing, MD;  Location: [facility];  Service: Orthopedics;  Laterality: Right;    Family History  Problem Relation Age of Onset  . Heart disease    . Arthritis    . Lung disease    . Kidney disease    . Coronary artery disease Mother   . Diabetes Sister   . Heart attack Brother   . Diabetes Son     History  Substance  Use Topics  . Smoking status: Never Smoker   . Smokeless tobacco: Not on file  . Alcohol Use: No    No OB history provided.  Review of Systems  A complete 10 system review of systems was obtained and all systems are negative except as noted in the HPI and PMH.   Allergies  Review of patient's allergies indicates no known allergies.  Home Medications   Current Outpatient Rx  Name  Route  Sig  Dispense  Refill  . aspirin 325 MG tablet               . glipiZIDE (GLUCOTROL) 5 MG tablet   Oral   Take 5 mg by mouth 2 (two) times daily before a meal.         . HYDROcodone-acetaminophen (NORCO) 7.5-325 MG per tablet   Oral   Take 1 tablet by mouth every 6 (six) hours as needed for pain.   56 tablet   2   . lisinopril-hydrochlorothiazide (PRINZIDE,ZESTORETIC) 20-25 MG per tablet   Oral   Take 1 tablet by mouth 2 (two) times daily.         . metFORMIN (GLUCOPHAGE) 500 MG tablet   Oral   Take 1,000 mg by mouth 2 (two) times daily with a meal.         . oxyCODONE-acetaminophen (PERCOCET/ROXICET) 5-325  MG per tablet   Oral   Take 1 tablet by mouth every 6 (six) hours as needed for pain.   60 tablet   0   . simvastatin (ZOCOR) 20 MG tablet   Oral   Take 20 mg by mouth every evening.           Triage Vitals: BP 128/80  Pulse 86  Temp(Src) 97.8 F (36.6 C) (Oral)  Resp 18  Ht 5\' 7"  (1.702 m)  Wt 289 lb (131.09 kg)  BMI 45.25 kg/m2  SpO2 96%  Physical Exam  Nursing note and vitals reviewed. Constitutional: She is oriented to person, place, and time. She appears well-developed and well-nourished. No distress.  HENT:  Head: Normocephalic and atraumatic.  Eyes: Conjunctivae and EOM are normal.  Neck: Normal range of motion. Neck supple. No tracheal deviation present.  Cardiovascular: Normal rate and regular rhythm.   Pulmonary/Chest: Effort normal and breath sounds normal. No respiratory distress. She exhibits no tenderness (no chest wall tenderness).   Abdominal: Soft. There is no tenderness.  Obese abdomen  Musculoskeletal: Normal range of motion. She exhibits no edema.  Neurological: She is alert and oriented to person, place, and time.  Skin: Skin is warm and dry.  No noticeable rashes underneath the left breast  Psychiatric: She has a normal mood and affect. Her behavior is normal.    ED Course  Procedures (including critical care time)  DIAGNOSTIC STUDIES: Oxygen Saturation is 96% on room air, adequate by my interpretation.    COORDINATION OF CARE: 10:25 AM-Discussed treatment plan which includes ASA, CXR, CBC panel, UA and troponin with pt at bedside and pt agreed to plan.   10:30 AM-Ordered 325 mg ASA tablet   11:46 AM-Consult complete with Dr. Felecia Shelling. Patient case explained and discussed. Dr. Felecia Shelling agrees to admit patient for further evaluation and treatment to tele. Call ended at 11:47 AM.  12:01 PM-Pt rechecked and feels improved. Informed pt of consult with Fanta and plan to admit. Pt is agreeable to this plan.  Labs Reviewed  COMPREHENSIVE METABOLIC PANEL - Abnormal; Notable for the following:    Potassium 3.4 (*)    Glucose, Bld 122 (*)    Total Bilirubin 0.2 (*)    GFR calc non Af Amer 74 (*)    GFR calc Af Amer 86 (*)    All other components within normal limits  CBC WITH DIFFERENTIAL  TROPONIN I  URINALYSIS, ROUTINE W REFLEX MICROSCOPIC   Dg Chest 2 View  05/23/2012  *RADIOLOGY REPORT*  Clinical Data: Chest pain.  CHEST - 2 VIEW  Comparison: None.  Findings: The heart size is normal.  The lungs are clear. The lung volumes are somewhat low.  Mild degenerative changes are noted in the thoracic spine.  IMPRESSION: No acute cardiopulmonary disease.   Original Report Authenticated By: Marin Roberts, M.D.      No diagnosis found.    MDM  Episode of left sided chest pain that radiated to left arm lasting for 45 minutes. Now resolved. Different than previous acid reflux. No nausea or vomiting or  shortness of breath. No cardiac history and has never had a stress test.  EKG nonischemic. Troponin negative. Episode of chest pain concerning for angina associated with left arm pain, nausea and shortness of breath. Now resolved. This is dissimilar to previous indigestion episodes.  Patient has never had a cardiac workup. Discussed with Dr. Felecia Shelling who agrees to admit patient for cardiac rule out.   Date: 05/23/2012  Rate: 80  Rhythm: normal sinus rhythm  QRS Axis: normal  Intervals: normal  ST/T Wave abnormalities: normal  Conduction Disutrbances:none  Narrative Interpretation:   Old EKG Reviewed: unchanged      I personally performed the services described in this documentation, which was scribed in my presence. The recorded information has been reviewed and is accurate.    Glynn Octave, MD 05/23/12 [phone]

## 2012-05-23 NOTE — Progress Notes (Signed)
ANTICOAGULATION CONSULT NOTE - Initial Consult  Pharmacy Consult for Lovenox Indication: VTE prophylaxis  No Known Allergies  Patient Measurements: Height: 5\' 7"  (170.2 cm) Weight: 289 lb (131.09 kg) IBW/kg (Calculated) : 61.6  Vital Signs: Temp: 97.8 F (36.6 C) (02/10 1007) Temp src: Oral (02/10 1007) BP: 114/53 mmHg (02/10 1245) Pulse Rate: 71 (02/10 1245)  Labs:  Recent Labs  05/23/12 1034  HGB 12.1  HCT 38.9  PLT 241  CREATININE 0.83  TROPONINI <0.30    Estimated Creatinine Clearance: 99.2 ml/min (by C-G formula based on Cr of 0.83).   Medical History: Past Medical History  Diagnosis Date  . Diabetes mellitus   . HTN (hypertension)   . High cholesterol   . Arthritis     Medications:  Scheduled:  . sodium chloride   Intravenous STAT  . [COMPLETED] aspirin  324 mg Oral Once  . aspirin EC  81 mg Oral Once  . glipiZIDE  5 mg Oral BID AC  . lisinopril  20 mg Oral BID   And  . hydrochlorothiazide  25 mg Oral BID  . metFORMIN  1,000 mg Oral BID WC  . simvastatin  20 mg Oral QPM  . [DISCONTINUED] lisinopril-hydrochlorothiazide  1 tablet Oral BID    Assessment: 62 yo obese F admitted with chest pain to start on Lovenox for VTE px while hospitalized. No hx of bleeding noted. Good renal function.   Goal of Therapy:  Monitor platelets by anticoagulation protocol: Yes   Plan:  Lovenox 60mg  sq daily Monitor for s/sx of bleeding Pharmacy to sign off- please re-consult as needed  Elson Clan  05/23/2012,3:07 PM

## 2012-05-23 NOTE — ED Notes (Signed)
Pt c/o cp that felt like indigestion and lasted for about an hour. Denies pain or any shortness of breath.

## 2012-05-24 DIAGNOSIS — E7800 Pure hypercholesterolemia, unspecified: Secondary | ICD-10-CM

## 2012-05-24 DIAGNOSIS — I10 Essential (primary) hypertension: Secondary | ICD-10-CM

## 2012-05-24 DIAGNOSIS — E119 Type 2 diabetes mellitus without complications: Secondary | ICD-10-CM

## 2012-05-24 DIAGNOSIS — R079 Chest pain, unspecified: Secondary | ICD-10-CM

## 2012-05-24 LAB — TROPONIN I: Troponin I: <0.3 ng/mL (ref <0.30)

## 2012-05-24 NOTE — Progress Notes (Signed)
IV removed, site WNL.  Pt given d/c instructions and new prescriptions.  Discussed home care with patient and discussed home medications, patient verbalizes understanding, teachback completed. F/U appointments in place, pt states they will keep appointment. Pt is stable and desires to go home at this time. Stress test arranged for 2-21, npo instructions verbalized by pt. Pt taken to main entrance in wheelchair by staff member.

## 2012-05-24 NOTE — Consult Note (Signed)
CARDIOLOGY CONSULT NOTE  Patient ID: [name] Price MRN: [id number] DOB/AGE: May 25, 1950 62 y.o.  Admit date: 05/23/2012 Referring Physician: Felecia Shelling, MD Primary PhysicianFANTA,TESFAYE, MD Primary Cardiologist: Ellis Parents) Valera Castle MD Reason for Consultation: Chest Pain Active Problems:   OA (osteoarthritis) of knee   S/P total knee replacement   Chest pain   Diabetes   Essential hypertension   Hypercholesterolemia  HPI: [name] Price is a 62 y/o female without prior cardiac history who was admitted with sudden onset of severe "heartburn": in the left chest with radiation down the left arm, with associated sharp pain, while at rest yesterday am. She did not have dyspnea, diaphoresis, or weakness. She rested, took two baby ASA and came to ER after pain was not relieved, however, pain was relieved by the time she arrived. In ER she was treated with more baby ASA and IV fluids. She was not found to be hypertensive, BP 128/80; K+ 3.4, Troponin I negative X3. EKG demonstrated NSR without ACS. She was relieved from CP in the ER and has not had recurrence since admission. She has no prior history of chest pain or cardiac work up.   She has a prior history of Type II diabetes, hypertension, hypercholesterolemia, and FH of MI in mother and brother. She does not smoke but is in a household with smokers, and has subjected to second hand smoking for years.     Review of systems complete and found to be negative unless listed above   Past Medical History  Diagnosis Date  . Diabetes mellitus   . HTN (hypertension)   . High cholesterol   . Arthritis     Family History  Problem Relation Age of Onset  . Heart disease    . Arthritis    . Lung disease    . Kidney disease    . Coronary artery disease Mother   . Diabetes Sister   . Heart attack Brother   . Diabetes Son     History   Social History  . Marital Status: Married    Spouse Name: N/A    Number of Children: N/A  . Years of Education:  college   Occupational History  . Buyer, retail   Social History Main Topics  . Smoking status: Never Smoker   . Smokeless tobacco: Not on file  . Alcohol Use: No  . Drug Use: No  . Sexually Active: No   Other Topics Concern  . Not on file   Social History Narrative  . No narrative on file    Past Surgical History  Procedure Laterality Date  . Total knee arthroplasty  11/30/2011    Procedure: TOTAL KNEE ARTHROPLASTY;  Surgeon: Vickki Hearing, MD;  Location: [facility];  Service: Orthopedics;  Laterality: Right;     Prescriptions prior to admission  Medication Sig Dispense Refill  . aspirin EC 81 MG tablet Take 81 mg by mouth once. For chest pain      . glipiZIDE (GLUCOTROL) 5 MG tablet Take 5 mg by mouth 2 (two) times daily before a meal.      . HYDROcodone-acetaminophen (NORCO) 7.5-325 MG per tablet Take 1 tablet by mouth every 6 (six) hours as needed for pain.  56 tablet  2  . lisinopril-hydrochlorothiazide (PRINZIDE,ZESTORETIC) 20-25 MG per tablet Take 1 tablet by mouth 2 (two) times daily.      . metFORMIN (GLUCOPHAGE) 500 MG tablet Take 1,000 mg by mouth 2 (two) times daily with a meal.      .  simvastatin (ZOCOR) 20 MG tablet Take 20 mg by mouth every evening.      . traMADol (ULTRAM) 50 MG tablet Take 50 mg by mouth every 6 (six) hours as needed for pain.        Physical Exam: Blood pressure 113/76, pulse 85, temperature 98.2 F (36.8 C), temperature source Oral, resp. rate 20, height 5\' 7"  (1.702 m), weight 289 lb (131.09 kg), SpO2 92.00%. General: Well developed, well nourished, in no acute distress Head: Eyes PERRLA, No xanthomas.   Normal cephalic and atramatic  Lungs: Clear bilaterally to auscultation and percussion. Heart: HRRR S1 S2, without MRG.  Pulses are 2+ & equal.            No carotid bruit. No JVD.  No abdominal bruits. No femoral bruits. Abdomen: Bowel sounds are positive, abdomen soft and non-tender without masses or                  Hernia's  noted. Msk:  Back normal, normal gait. Normal strength and tone for age. Extremities: No clubbing, cyanosis or edema.  DP +1 Neuro: Alert and oriented X 3. Psych:  Good affect, responds appropriately    Labs:   Lab Results  Component Value Date   WBC 7.3 05/23/2012   HGB 12.1 05/23/2012   HCT 38.9 05/23/2012   MCV 91.5 05/23/2012   PLT 241 05/23/2012     Recent Labs Lab 05/23/12 1034  NA 138  K 3.4*  CL 98  CO2 32  BUN 17  CREATININE 0.83  CALCIUM 9.5  PROT 7.3  BILITOT 0.2*  ALKPHOS 55  ALT 6  AST 9  GLUCOSE 122*   Lab Results  Component Value Date   TROPONINI <0.30 05/24/2012        Radiology: Dg Chest 2 View  05/23/2012  *RADIOLOGY REPORT*  Clinical Data: Chest pain.  CHEST - 2 VIEW  Comparison: None.  Findings: The heart size is normal.  The lungs are clear. The lung volumes are somewhat low.  Mild degenerative changes are noted in the thoracic spine.  IMPRESSION: No acute cardiopulmonary disease.   Original Report Authenticated By: Marin Roberts, M.D.    EKG:NSR with Left atrial enlargement.  ASSESSMENT AND PLAN:   1. Chest Pain: Worrisome for unstable angina. Multiple CVRF. She is now pain free with negative CE and EKG. She will need further cardiac evaluation with stress test vs cardiac catheterization.Discussion with Dr. Daleen Squibb with recommendations for stress myoview planned as OP.   2. Hypertension: Currently well controlled on Prinivil 20 mg BID.Creatinine 0.83  3. Hypercholesterolemia: Currently on a statin. Followed by Dr. Felecia Shelling for labs.  4. GERD 5. Arthritis 6. Diabetes Signed: Bettey Mare. Lyman Bishop NP [facility] 05/24/2012, 8:38 AM Co-Sign MD I have taken a history, reviewed medications, allergies, PMH, SH, FH, and reviewed ROS and examined the patient.  I agree with the assessment and plan. Her symptoms are somewhat atypical but with multiple cardiac risk factors we need to exclude obstructive coronary artery disease. We'll arrange  an outpatient Myoview. Discussed with patient and she is comfortable  with this plan.  Scotlyn Mccranie C. Daleen Squibb, MD, [facility] Pager:  [phone]

## 2012-05-24 NOTE — Discharge Summary (Signed)
Physician Discharge Summary  Patient ID: SOLYANA NONAKA MRN: [id number] DOB/AGE: 1950-08-22 62 y.o. Primary Care Physician:Champ Keetch, MD Admit date: 05/23/2012 Discharge date: 05/24/2012    Discharge Diagnoses:   1. Chest pain  2. DM type II - controlled  3.Hypertension  4. Morbid obesity  5. Osteoarthritis  6. S/P knee replacement  Active Problems:   OA (osteoarthritis) of knee   S/P total knee replacement   Chest pain   Diabetes   Essential hypertension   Hypercholesterolemia     Medication List    TAKE these medications       aspirin EC 81 MG tablet  Take 81 mg by mouth once. For chest pain     glipiZIDE 5 MG tablet  Commonly known as:  GLUCOTROL  Take 5 mg by mouth 2 (two) times daily before a meal.     HYDROcodone-acetaminophen 7.5-325 MG per tablet  Commonly known as:  NORCO  Take 1 tablet by mouth every 6 (six) hours as needed for pain.     lisinopril-hydrochlorothiazide 20-25 MG per tablet  Commonly known as:  PRINZIDE,ZESTORETIC  Take 1 tablet by mouth 2 (two) times daily.     metFORMIN 500 MG tablet  Commonly known as:  GLUCOPHAGE  Take 1,000 mg by mouth 2 (two) times daily with a meal.     simvastatin 20 MG tablet  Commonly known as:  ZOCOR  Take 20 mg by mouth every evening.     traMADol 50 MG tablet  Commonly known as:  ULTRAM  Take 50 mg by mouth every 6 (six) hours as needed for pain.        Discharged Condition: improved    Consults: cardiology  Significant Diagnostic Studies: Dg Chest 2 View  05/23/2012  *RADIOLOGY REPORT*  Clinical Data: Chest pain.  CHEST - 2 VIEW  Comparison: None.  Findings: The heart size is normal.  The lungs are clear. The lung volumes are somewhat low.  Mild degenerative changes are noted in the thoracic spine.  IMPRESSION: No acute cardiopulmonary disease.   Original Report Authenticated By: Marin Roberts, M.D.     Lab Results: Basic Metabolic Panel:  Recent Labs  40/98/11 1034  NA 138   K 3.4*  CL 98  CO2 32  GLUCOSE 122*  BUN 17  CREATININE 0.83  CALCIUM 9.5   Liver Function Tests:  Recent Labs  05/23/12 1034  AST 9  ALT 6  ALKPHOS 55  BILITOT 0.2*  PROT 7.3  ALBUMIN 3.5     CBC:  Recent Labs  05/23/12 1034  WBC 7.3  NEUTROABS 4.7  HGB 12.1  HCT 38.9  MCV 91.5  PLT 241    No results found for this or any previous visit (from the past 240 hour(s)).   Hospital Course: This is a 62 years old female patient with history of multiple medical illnesses who was admitted to chest pain. Her EKG and serial cardiac enzyme were normal. Her chest pain resolved. She was evaluated by cardiologist who devised to discharge patient and perform stress test on out patient.  Discharge Exam: Blood pressure 113/76, pulse 85, temperature 98.2 F (36.8 C), temperature source Oral, resp. rate 20, height 5\' 7"  (1.702 m), weight 131.09 kg (289 lb), SpO2 92.00%.    Disposition:  home       Future Appointments Provider Department Dept Phone   06/03/2012 11:45 AM [facility] [phone]   06/07/2012 1:40 PM Jodelle Gross, NP [facility] [place] [phone]   11/23/2012 10:00 AM Vickki Hearing, MD [facility] [phone]      Follow-up Information   Follow up with [facility] On 06/03/2012. (Register at 9:15 am for stress test. NPO after midnight the night before test. Take your medications that morning.)    Contact information:   [address] [place], [place]      Follow up with Joni Reining, NP On 06/07/2012. (1:40 pm)    Contact information:   [address] [place]. [place] [address] [phone]       Signed: Avon Gully   05/24/2012, 1:27 PM

## 2012-05-24 NOTE — Progress Notes (Signed)
UR Chart Review Completed  

## 2012-05-24 NOTE — H&P (Signed)
[name] Price MRN: [id number] DOB/AGE: [birth year] 63 y.o. Primary Care Physician:Goebel Hellums, MD Admit date: 05/23/2012 Chief Complaint:  Chest pain HPI:  This is a 62 years old female patient with history of multiple medical illnesses came to ER due to chest pain. The pain under her lt breast radiating to the lt arm. She took 2 baby Asprin and the resolved by the time she was seen in ER. She was evaluated in ER and her initial EKG and cardiac enzymes were negative for acute coronary syndrome. Due to her risk factors patient was admitted for further evaluations. NO headache, fever, chills,cough, shortness of breath, nausea, vomiting, abdominal pain, dysuria or urgency or frequency of urination.  Past Medical History  Diagnosis Date  . Diabetes mellitus   . HTN (hypertension)   . High cholesterol   . Arthritis    Past Surgical History  Procedure Laterality Date  . Total knee arthroplasty  11/30/2011    Procedure: TOTAL KNEE ARTHROPLASTY;  Surgeon: Vickki Hearing, MD;  Location: [facility];  Service: Orthopedics;  Laterality: Right;        Family History  Problem Relation Age of Onset  . Heart disease    . Arthritis    . Lung disease    . Kidney disease    . Coronary artery disease Mother   . Diabetes Sister   . Heart attack Brother   . Diabetes Son     Social History:  reports that she has never smoked. She does not have any smokeless tobacco history on file. She reports that she does not drink alcohol or use illicit drugs.   Allergies: No Known Allergies  Medications Prior to Admission  Medication Sig Dispense Refill  . aspirin EC 81 MG tablet Take 81 mg by mouth once. For chest pain      . glipiZIDE (GLUCOTROL) 5 MG tablet Take 5 mg by mouth 2 (two) times daily before a meal.      . HYDROcodone-acetaminophen (NORCO) 7.5-325 MG per tablet Take 1 tablet by mouth every 6 (six) hours as needed for pain.  56 tablet  2  . lisinopril-hydrochlorothiazide (PRINZIDE,ZESTORETIC)  20-25 MG per tablet Take 1 tablet by mouth 2 (two) times daily.      . metFORMIN (GLUCOPHAGE) 500 MG tablet Take 1,000 mg by mouth 2 (two) times daily with a meal.      . simvastatin (ZOCOR) 20 MG tablet Take 20 mg by mouth every evening.      . traMADol (ULTRAM) 50 MG tablet Take 50 mg by mouth every 6 (six) hours as needed for pain.           WUJ:WJXBJ from the symptoms mentioned above,there are no other symptoms referable to all systems reviewed.  Physical Exam: Blood pressure 106/72, pulse 64, temperature 98.4 F (36.9 C), temperature source Oral, resp. rate 20, height 5\' 7"  (1.702 m), weight 131.09 kg (289 lb), SpO2 94.00%. General condition- alert, awake and comfortable HE ENT- pupils equal and reactive, neck supple Respiratory- good air entry, clear lung field CVS- S1 and S2 heard no murmur or gallop ABD- obese, soft and lax, bowel sound +  EXT- no leg edema    Recent Labs  05/23/12 1034  WBC 7.3  NEUTROABS 4.7  HGB 12.1  HCT 38.9  MCV 91.5  PLT 241    Recent Labs  05/23/12 1034  NA 138  K 3.4*  CL 98  CO2 32  GLUCOSE 122*  BUN 17  CREATININE 0.83  CALCIUM 9.5  lablast2(ast:2,ALT:2,alkphos:2,bilitot:2,prot:2,albumin:2)@    No results found for this or any previous visit (from the past 240 hour(s)).   Dg Chest 2 View  05/23/2012  *RADIOLOGY REPORT*  Clinical Data: Chest pain.  CHEST - 2 VIEW  Comparison: None.  Findings: The heart size is normal.  The lungs are clear. The lung volumes are somewhat low.  Mild degenerative changes are noted in the thoracic spine.  IMPRESSION: No acute cardiopulmonary disease.   Original Report Authenticated By: Marin Roberts, M.D.    Impression: 1. Chest pain 2. DM type II - controlled 3.Hypertension 4. Morbid obesity 5. Osteoarthritis 6. S/P knee replacement Active Problems:   * No active hospital problems. *     Plan:  Continue telemetry ekg and cardiac enzyme Cardiology consult Continue regular  treatment.        Taquita Demby Pager [phone]  05/24/2012, 6:46 AM

## 2012-05-24 NOTE — Progress Notes (Signed)
Subjective:  patient was admitted yesterday afternoon for chest pain. Her pain is resolved. She feels better now.  Objective: Vital signs in last 24 hours: Temp:  [97.6 F (36.4 C)-98.4 F (36.9 C)] 98.2 F (36.8 C) (02/11 0658) Pulse Rate:  [64-86] 85 (02/11 0658) Resp:  [18-20] 20 (02/11 0658) BP: (97-128)/(53-80) 97/67 mmHg (02/11 0658) SpO2:  [92 %-96 %] 92 % (02/11 0658) Weight:  [131.09 kg (289 lb)] 131.09 kg (289 lb) (02/10 1007) Weight change:  Last BM Date: 05/22/12  Intake/Output from previous day: 02/10 0701 - 02/11 0700 In: 240 [P.O.:240] Out: -   PHYSICAL EXAM General appearance: alert and no distress Resp: clear to auscultation bilaterally Cardio: S1, S2 normal GI: soft, non-tender; bowel sounds normal; no masses,  no organomegaly Extremities: extremities normal, atraumatic, no cyanosis or edema  Lab Results:    @labtest @ [facility] No results found for this basename: PHART, PCO2, PO2ART, TCO2, HCO3,  in the last 72 hours CULTURES No results found for this or any previous visit (from the past 240 hour(s)). Studies/Results: Dg Chest 2 View  05/23/2012  *RADIOLOGY REPORT*  Clinical Data: Chest pain.  CHEST - 2 VIEW  Comparison: None.  Findings: The heart size is normal.  The lungs are clear. The lung volumes are somewhat low.  Mild degenerative changes are noted in the thoracic spine.  IMPRESSION: No acute cardiopulmonary disease.   Original Report Authenticated By: Marin Roberts, M.D.     Medications: I have reviewed the patient's current medications.  Assesment: 1. Chest pain  2. DM type II - controlled  3.Hypertension  4. Morbid obesity  5. Osteoarthritis  6. S/P knee replacement  Active Problems:   * No active hospital problems. *    Plan: Cardiology consult Continue regular treatment    LOS: 1 day   Latham Kinzler 05/24/2012, 7:11 AM

## 2012-05-24 NOTE — Care Management Note (Signed)
    Page 1 of 1   05/24/2012     1:28:16 PM   CARE MANAGEMENT NOTE 05/24/2012  Patient:  [name] Price, [name] Price   Account Number:  [id number]  Date Initiated:  05/24/2012  Documentation initiated by:  Sharrie Rothman  Subjective/Objective Assessment:   Pt admitted from home with CP. Pt lives with her husband and will return home at discharge. Pt is independent with ADL's.     Action/Plan:   No CM or HH needs noted.   Anticipated DC Date:  05/24/2012   Anticipated DC Plan:  HOME/SELF CARE      DC Planning Services  CM consult      Choice offered to / List presented to:             Status of service:  Completed, signed off Medicare Important Message given?   (If response is "NO", the following Medicare IM given date fields will be blank) Date Medicare IM given:   Date Additional Medicare IM given:    Discharge Disposition:  HOME/SELF CARE  Per UR Regulation:    If discussed at Long Length of Stay Meetings, dates discussed:    Comments:  05/24/12 1330 Arlyss Queen, RN BSN CM

## 2012-05-30 ENCOUNTER — Other Ambulatory Visit: Payer: Self-pay | Admitting: Cardiology

## 2012-05-30 DIAGNOSIS — R079 Chest pain, unspecified: Secondary | ICD-10-CM

## 2012-05-31 ENCOUNTER — Other Ambulatory Visit: Payer: Self-pay | Admitting: Orthopedic Surgery

## 2012-05-31 DIAGNOSIS — Z96659 Presence of unspecified artificial knee joint: Secondary | ICD-10-CM

## 2012-05-31 MED ORDER — HYDROCODONE-ACETAMINOPHEN 5-325 MG PO TABS: 1.0000 | ORAL_TABLET | Freq: Four times a day (QID) | ORAL | Status: DC | PRN

## 2012-05-31 NOTE — Telephone Encounter (Signed)
Letter to PCP and pt.

## 2012-06-03 ENCOUNTER — Encounter (HOSPITAL_COMMUNITY): Payer: 59

## 2012-06-03 ENCOUNTER — Inpatient Hospital Stay (HOSPITAL_COMMUNITY): Admit: 2012-06-03 | Payer: 59

## 2012-06-07 ENCOUNTER — Encounter: Payer: 59 | Admitting: Adult Health

## 2012-09-27 ENCOUNTER — Telehealth: Payer: Self-pay

## 2012-09-27 DIAGNOSIS — Z1211 Encounter for screening for malignant neoplasm of colon: Secondary | ICD-10-CM

## 2012-09-27 NOTE — Telephone Encounter (Signed)
Pt called to be triaged for her first colonoscopy. She will be a new patient. [phone]

## 2012-09-28 NOTE — Telephone Encounter (Signed)
Gastroenterology Pre-Procedure Form   Request Date: 09/27/2012    Requesting Physician: Dr. Felecia Shelling     PATIENT INFORMATION:  [name] Price is a 62 y.o., female (DOB=April 29, 1950).  PROCEDURE: Procedure(s) requested: colonoscopy Procedure Reason: screening for colon cancer  PATIENT REVIEW QUESTIONS: The patient reports the following:   1. Diabetes Melitis: YES 2. Joint replacements in the past 12 months: YES  Right knee in August 2013 3. Major health problems in the past 3 months: NO 4. Has an artificial valve or MVP:no 5. Has been advised in past to take antibiotics in advance of a procedure like teeth cleaning: no    MEDICATIONS & ALLERGIES:    Patient reports the following regarding taking any blood thinners:   Plavix? no Aspirin? YES Coumadin?  no  Patient confirms/reports the following medications:  Current Outpatient Prescriptions  Medication Sig Dispense Refill  . aspirin EC 81 MG tablet Take 81 mg by mouth once. For chest pain      . glipiZIDE (GLUCOTROL) 5 MG tablet Take 5 mg by mouth 2 (two) times daily before a meal.      . HYDROcodone-acetaminophen (NORCO) 5-325 MG per tablet Take 1 tablet by mouth every 6 (six) hours as needed for pain.  56 tablet  5  . lisinopril-hydrochlorothiazide (PRINZIDE,ZESTORETIC) 20-25 MG per tablet Take 1 tablet by mouth 2 (two) times daily.      . metFORMIN (GLUCOPHAGE) 500 MG tablet Take 1,000 mg by mouth 2 (two) times daily with a meal.      . simvastatin (ZOCOR) 20 MG tablet Take 20 mg by mouth every evening.      . traMADol (ULTRAM) 50 MG tablet Take 50 mg by mouth every 6 (six) hours as needed for pain.       No current facility-administered medications for this visit.    Patient confirms/reports the following allergies:  No Known Allergies  Patient is appropriate to schedule for requested procedure(s): yes  AUTHORIZATION INFORMATION Primary Insurance:   ID #:  Group #:  Pre-Cert / Auth required: Pre-Cert / Auth #:    Secondary Insurance:   ID #:  Group #:  Pre-Cert / Auth required:  Pre-Cert / Auth #:   No orders of the defined types were placed in this encounter.    SCHEDULE INFORMATION: Procedure has been scheduled as follows:  Date:  Time:  Location:     This Gastroenterology Pre-Precedure Form is being routed to the following provider(s) for review: Jonette Eva, MD

## 2012-09-30 NOTE — Telephone Encounter (Signed)
MOVI PREP SPLIT DOSING, CLEAR LIQUIDS WITH BREAKFAST.  CONTINUE GLUCOPHAGE. HOLD GLUCOTROL ON EVENING PRIOR TO TCS AND MORNING OF TCS.  DRINK WATER TO KEEP YOUR URINE LIGHT YELLOW.

## 2012-10-03 ENCOUNTER — Other Ambulatory Visit: Payer: Self-pay

## 2012-10-03 ENCOUNTER — Encounter (HOSPITAL_COMMUNITY): Payer: Self-pay | Admitting: Pharmacy Technician

## 2012-10-03 DIAGNOSIS — Z1211 Encounter for screening for malignant neoplasm of colon: Secondary | ICD-10-CM

## 2012-10-03 MED ORDER — PEG-KCL-NACL-NASULF-NA ASC-C 100 G PO SOLR: 1.0000 | ORAL | Status: DC

## 2012-10-03 NOTE — Addendum Note (Signed)
Addended by: Lavena Bullion on: 10/03/2012 11:35 AM   Modules accepted: Orders

## 2012-10-03 NOTE — Telephone Encounter (Signed)
Rx sent to pharmacy and instructions mailed to pt.  

## 2012-10-04 ENCOUNTER — Other Ambulatory Visit: Payer: Self-pay | Admitting: Orthopedic Surgery

## 2012-10-04 DIAGNOSIS — Z96659 Presence of unspecified artificial knee joint: Secondary | ICD-10-CM

## 2012-10-04 MED ORDER — HYDROCODONE-ACETAMINOPHEN 5-325 MG PO TABS: 1.0000 | ORAL_TABLET | Freq: Four times a day (QID) | ORAL | Status: DC | PRN

## 2012-10-11 ENCOUNTER — Telehealth: Payer: Self-pay

## 2012-10-11 NOTE — Telephone Encounter (Signed)
Called [employer] at [phone] and spoke to [facility]. No PA required for screening colonoscopy.

## 2012-10-13 ENCOUNTER — Encounter (HOSPITAL_COMMUNITY): Payer: Self-pay | Admitting: *Deleted

## 2012-10-13 ENCOUNTER — Ambulatory Visit (HOSPITAL_COMMUNITY)
Admission: RE | Admit: 2012-10-13 | Discharge: 2012-10-13 | Disposition: A | Discharge status: Home / Self Care | Payer: BC Managed Care – PPO | Source: Ambulatory Visit | Attending: Gastroenterology | Admitting: Gastroenterology

## 2012-10-13 ENCOUNTER — Encounter (HOSPITAL_COMMUNITY)
Admission: RE | Disposition: A | Discharge status: Home / Self Care | Payer: Self-pay | Source: Ambulatory Visit | Attending: Gastroenterology

## 2012-10-13 DIAGNOSIS — D126 Benign neoplasm of colon, unspecified: Secondary | ICD-10-CM | POA: Insufficient documentation

## 2012-10-13 DIAGNOSIS — Z1211 Encounter for screening for malignant neoplasm of colon: Secondary | ICD-10-CM | POA: Insufficient documentation

## 2012-10-13 DIAGNOSIS — K648 Other hemorrhoids: Secondary | ICD-10-CM

## 2012-10-13 DIAGNOSIS — K5730 Diverticulosis of large intestine without perforation or abscess without bleeding: Secondary | ICD-10-CM

## 2012-10-13 DIAGNOSIS — E119 Type 2 diabetes mellitus without complications: Secondary | ICD-10-CM | POA: Insufficient documentation

## 2012-10-13 DIAGNOSIS — Z01812 Encounter for preprocedural laboratory examination: Secondary | ICD-10-CM | POA: Insufficient documentation

## 2012-10-13 DIAGNOSIS — I10 Essential (primary) hypertension: Secondary | ICD-10-CM | POA: Insufficient documentation

## 2012-10-13 DIAGNOSIS — D129 Benign neoplasm of anus and anal canal: Secondary | ICD-10-CM | POA: Insufficient documentation

## 2012-10-13 DIAGNOSIS — D128 Benign neoplasm of rectum: Secondary | ICD-10-CM | POA: Insufficient documentation

## 2012-10-13 HISTORY — PX: COLONOSCOPY: SHX5424

## 2012-10-13 SURGERY — COLONOSCOPY
Anesthesia: Moderate Sedation

## 2012-10-13 MED ORDER — MEPERIDINE HCL 100 MG/ML IJ SOLN
INTRAMUSCULAR | Status: AC
  Filled 2012-10-13: qty 2

## 2012-10-13 MED ORDER — SIMETHICONE 40 MG/0.6ML PO SUSP
ORAL | Status: DC | PRN
  Administered 2012-10-13: 09:00:00

## 2012-10-13 MED ORDER — MEPERIDINE HCL 100 MG/ML IJ SOLN
INTRAMUSCULAR | Status: DC | PRN
  Administered 2012-10-13 (×3): 25 mg via INTRAVENOUS

## 2012-10-13 MED ORDER — SODIUM CHLORIDE 0.9 % IV SOLN
INTRAVENOUS | Status: DC
  Administered 2012-10-13: 1000 mL via INTRAVENOUS

## 2012-10-13 MED ORDER — MIDAZOLAM HCL 5 MG/5ML IJ SOLN
INTRAMUSCULAR | Status: DC | PRN
  Administered 2012-10-13 (×3): 2 mg via INTRAVENOUS

## 2012-10-13 MED ORDER — MIDAZOLAM HCL 5 MG/5ML IJ SOLN
INTRAMUSCULAR | Status: AC
  Filled 2012-10-13: qty 10

## 2012-10-13 NOTE — Op Note (Signed)
[facility] [address] [place], [address]   COLONOSCOPY PROCEDURE REPORT  PATIENT: [name], [name]  MR#: [id number] BIRTHDATE: [birth year] , 62  yrs. old GENDER: Female ENDOSCOPIST: Jonette Eva, MD REFERRED XB:[id number] Fanta, M.D. PROCEDURE DATE:  10/13/2012 PROCEDURE:   Colonoscopy with cold biopsy polypectomy INDICATIONS:Average risk patient for colon cancer. MEDICATIONS: Demerol 75 mg IV and Versed 6 mg IV  DESCRIPTION OF PROCEDURE:    Physical exam was performed.  Informed consent was obtained from the patient after explaining the benefits, risks, and alternatives to procedure.  The patient was connected to monitor and placed in left lateral position. Continuous oxygen was provided by nasal cannula and IV medicine administered through an indwelling cannula.  After administration of sedation and rectal exam, the patients rectum was intubated and the EC-3890Li ([id number])  colonoscope was advanced under direct visualization to the ileum.  The scope was removed slowly by carefully examining the color, texture, anatomy, and integrity mucosa on the way out.  The patient was recovered in endoscopy and discharged home in satisfactory condition.    COLON FINDINGS: The mucosa appeared normal in the terminal ileum.  , Two sessile polyps measuring 3-4 mm in size were found in the descending colon and sigmoid colon.  A polypectomy was performed with cold forceps.  , There was mild diverticulosis noted in the sigmoid colon with associated muscular hypertrophy.  , and Moderate sized internal hemorrhoids were found.  PREP QUALITY: good.      CECAL W/D TIME: 13 minutes COMPLICATIONS: None  ENDOSCOPIC IMPRESSION: 1.   Two colon polyps removed 3.   Mild diverticulosis in the sigmoid colon 4.   Moderate sized internal hemorrhoids  RECOMMENDATIONS: FOLLOW A HIGH FIBER/low fat DIET.  AVOID ITEMS THAT CAUSE BLOATING.  CONTINUE WEIGHT LOSS EFFORTS.  OBESITY INCREASES  RISK FOR ALL CANCERS INCLUDING COLON CANCER. BIOPSY RESULTS SHOULD BE BACK IN 7 DAYS. Next colonoscopy in 10 years.       _______________________________ Rosalie DoctorJonette Eva, MD 10/13/2012 2:23 PM     PATIENT NAME:  [name], [name] MR#: [id number]

## 2012-10-13 NOTE — H&P (Signed)
  Primary Care Physician:  Avon Gully, MD Primary Gastroenterologist:  Dr. Darrick Penna  Pre-Procedure History & Physical: HPI:  [name] [name] is a 61 y.o. female here for COLON CANCER SCREENING.  Past Medical History  Diagnosis Date  . Diabetes mellitus   . HTN (hypertension)   . High cholesterol   . Arthritis     Past Surgical History  Procedure Laterality Date  . Total knee arthroplasty  11/30/2011    Procedure: TOTAL KNEE ARTHROPLASTY;  Surgeon: Vickki Hearing, MD;  Location: [facility];  Service: Orthopedics;  Laterality: Right;    Prior to Admission medications   Medication Sig Start Date End Date Taking? Authorizing Provider  Alum & Mag Hydroxide-Simeth (ANTACID ANTI-GAS PO) Take 1 tablet by mouth daily.   Yes Historical Provider, MD  aspirin EC 81 MG tablet Take 81 mg by mouth daily.    Yes Historical Provider, MD  cetirizine (ZYRTEC) 10 MG tablet Take 10 mg by mouth daily.   Yes Historical Provider, MD  glipiZIDE (GLUCOTROL) 5 MG tablet Take 5 mg by mouth 2 (two) times daily before a meal.   Yes Historical Provider, MD  HYDROcodone-acetaminophen (NORCO) 5-325 MG per tablet Take 1 tablet by mouth every 6 (six) hours as needed for pain. 10/04/12  Yes Vickki Hearing, MD  lisinopril-hydrochlorothiazide (PRINZIDE,ZESTORETIC) 20-25 MG per tablet Take 1 tablet by mouth 2 (two) times daily.   Yes Historical Provider, MD  metFORMIN (GLUCOPHAGE) 500 MG tablet Take 1,000 mg by mouth 2 (two) times daily with a meal.   Yes Historical Provider, MD  simvastatin (ZOCOR) 20 MG tablet Take 20 mg by mouth every evening.   Yes Historical Provider, MD    Allergies as of 10/03/2012  . (No Known Allergies)    Family History  Problem Relation Age of Onset  . Heart disease    . Arthritis    . Lung disease    . Kidney disease    . Coronary artery disease Mother   . Diabetes Sister   . Heart attack Brother   . Diabetes Son     History   Social History  . Marital Status: Married   Spouse Name: N/A    Number of Children: N/A  . Years of Education: college   Occupational History  . Buyer, retail   Social History Main Topics  . Smoking status: Never Smoker   . Smokeless tobacco: Not on file  . Alcohol Use: No  . Drug Use: No  . Sexually Active: No   Other Topics Concern  . Not on file   Social History Narrative  . No narrative on file    Review of Systems: See HPI, otherwise negative ROS   Physical Exam: BP 115/76  Pulse 90  Temp(Src) 97.9 F (36.6 C) (Oral)  Ht 5\' 8"  (1.727 m)  Wt 280 lb (127.007 kg)  BMI 42.58 kg/m2  SpO2 93% General:   Alert,  pleasant and cooperative in NAD Head:  Normocephalic and atraumatic. Neck:  Supple; Lungs:  Clear throughout to auscultation.    Heart:  Regular rate and rhythm. Abdomen:  Soft, nontender and nondistended. Normal bowel sounds, without guarding, and without rebound.   Neurologic:  Alert and  oriented x4;  grossly normal neurologically.  Impression/Plan:     SCREENING  Plan:  1. TCS TODAY

## 2012-10-19 ENCOUNTER — Encounter (HOSPITAL_COMMUNITY): Payer: Self-pay | Admitting: Gastroenterology

## 2012-10-20 ENCOUNTER — Telehealth: Payer: Self-pay | Admitting: Gastroenterology

## 2012-10-20 NOTE — Telephone Encounter (Signed)
LMOM to call.

## 2012-10-20 NOTE — Telephone Encounter (Signed)
Please call pt. She had a simple adenoma removed from her colon.    FOLLOW A HIGH FIBER/low fat DIET. AVOID ITEMS THAT CAUSE BLOATING.   CONTINUE WEIGHT LOSS EFFORTS. YOUR BODY MASS INDEX IS OVER 40, WHICH MEANS YOU ARE MORBIDLY OBESE. OBESITY INCREASES YOUR RISK FOR ALL CANCERS INCLUDING COLON CANCER.  Next colonoscopy in 10 years.

## 2012-10-20 NOTE — Telephone Encounter (Signed)
Cc PCP 

## 2012-10-24 NOTE — Telephone Encounter (Signed)
LMOM to call.

## 2012-10-26 ENCOUNTER — Encounter (INDEPENDENT_AMBULATORY_CARE_PROVIDER_SITE_OTHER): Payer: Self-pay

## 2012-10-26 NOTE — Telephone Encounter (Signed)
Letter mailed to pt to call for results.  

## 2012-11-23 ENCOUNTER — Ambulatory Visit: Payer: 59 | Admitting: Orthopedic Surgery

## 2012-11-24 ENCOUNTER — Ambulatory Visit (INDEPENDENT_AMBULATORY_CARE_PROVIDER_SITE_OTHER): Payer: BC Managed Care – PPO | Admitting: Orthopedic Surgery

## 2012-11-24 ENCOUNTER — Ambulatory Visit (INDEPENDENT_AMBULATORY_CARE_PROVIDER_SITE_OTHER): Payer: BC Managed Care – PPO

## 2012-11-24 ENCOUNTER — Encounter: Payer: Self-pay | Admitting: Orthopedic Surgery

## 2012-11-24 VITALS — BP 127/78 | Ht 68.0 in | Wt 280.0 lb

## 2012-11-24 DIAGNOSIS — Z96651 Presence of right artificial knee joint: Secondary | ICD-10-CM

## 2012-11-24 DIAGNOSIS — Z96659 Presence of unspecified artificial knee joint: Secondary | ICD-10-CM

## 2012-11-24 NOTE — Progress Notes (Signed)
Subjective:     Patient ID: [name] Price, female   DOB: 1950/08/01, 62 y.o.   MRN: [id number]  Chief Complaint  Patient presents with  . Follow-up    Yearly recheck Right TKA with Xrays  DOS 11/30/2011    HPI Status post right total knee arthroplasty last year this is her one-year followup she has no complaints  Review of Systems Negative musculoskeletal review of systems    Objective:   Physical Exam General appearance is normal, the patient is alert and oriented x3 with normal mood and affect. BP 127/78  Ht 5\' 8"  (1.727 m)  Wt 280 lb (127.007 kg)  BMI 42.58 kg/m2 Ambulation is normal.without assistive device, the flexor 115. Knee stability confirmed in AP plane and coronal plane    Assessment:     Status post total knee one year    Plan:     X-rays again one-year right knee

## 2012-11-24 NOTE — Patient Instructions (Signed)
activities as tolerated 

## 2013-01-18 ENCOUNTER — Telehealth: Payer: Self-pay | Admitting: Orthopedic Surgery

## 2013-01-18 NOTE — Telephone Encounter (Signed)
Patient called regarding medication refill, Hydrocodone/Norco 5-325 mg - states was made aware by her pharmacy that she will be unable to get refills without a new written prescription, per new regulation.  She uses [facility] in [place].  She has an appointment in 2015 to return for yearly Xrays.  Her ph# is [phone] .

## 2013-01-18 NOTE — Telephone Encounter (Signed)
Routing to Dr Harrison 

## 2013-11-30 ENCOUNTER — Ambulatory Visit: Payer: BC Managed Care – PPO | Admitting: Orthopedic Surgery

## 2013-12-06 ENCOUNTER — Encounter: Payer: Self-pay | Admitting: Orthopedic Surgery

## 2013-12-06 ENCOUNTER — Ambulatory Visit (INDEPENDENT_AMBULATORY_CARE_PROVIDER_SITE_OTHER): Payer: BC Managed Care – PPO | Admitting: Orthopedic Surgery

## 2013-12-06 ENCOUNTER — Ambulatory Visit (INDEPENDENT_AMBULATORY_CARE_PROVIDER_SITE_OTHER): Payer: BC Managed Care – PPO

## 2013-12-06 VITALS — BP 123/84 | Ht 68.0 in | Wt 280.0 lb

## 2013-12-06 DIAGNOSIS — Z96659 Presence of unspecified artificial knee joint: Secondary | ICD-10-CM

## 2013-12-06 DIAGNOSIS — IMO0002 Reserved for concepts with insufficient information to code with codable children: Secondary | ICD-10-CM | POA: Insufficient documentation

## 2013-12-06 DIAGNOSIS — M1710 Unilateral primary osteoarthritis, unspecified knee: Secondary | ICD-10-CM

## 2013-12-06 DIAGNOSIS — Z96651 Presence of right artificial knee joint: Secondary | ICD-10-CM

## 2013-12-06 NOTE — Progress Notes (Signed)
Chief complaint total knee follow-up.  History this is a follow-up visit. Status post right total knee replacement.  DOS:  Chief Complaint  Patient presents with  . Follow-up    yearly recheck and xray Right tka, DOS 11/30/11     Implant DePuy no  Tamala Julian / nephew Review of systems patient has no complaints.  Exam Physical Exam(6) GENERAL: normal development   CDV: pulses are normal   Skin: normal  Psychiatric: awake, alert and oriented  Neuro: normal sensation  1 ambulation normal without assistive devices 2 ROM = 115 3 Motor normal  4 Stability normal   Separate x-ray report.  Reason for x-ray, and we'll x-ray follow-up knee replacement.  3 views right knee.  The implant is aligned normally. There is no loosening.  Impression normal appearing knee replacement.    Assessment: Knee replacement functioning well    Plan: One year follow

## 2013-12-06 NOTE — Patient Instructions (Signed)
activities as tolerated 

## 2013-12-07 ENCOUNTER — Ambulatory Visit: Payer: BC Managed Care – PPO | Admitting: Orthopedic Surgery

## 2014-05-03 DIAGNOSIS — I10 Essential (primary) hypertension: Secondary | ICD-10-CM | POA: Diagnosis not present

## 2014-05-03 DIAGNOSIS — E119 Type 2 diabetes mellitus without complications: Secondary | ICD-10-CM | POA: Diagnosis not present

## 2014-05-03 DIAGNOSIS — M1990 Unspecified osteoarthritis, unspecified site: Secondary | ICD-10-CM | POA: Diagnosis not present

## 2014-08-02 DIAGNOSIS — E119 Type 2 diabetes mellitus without complications: Secondary | ICD-10-CM | POA: Diagnosis not present

## 2014-08-02 DIAGNOSIS — M1990 Unspecified osteoarthritis, unspecified site: Secondary | ICD-10-CM | POA: Diagnosis not present

## 2014-08-02 DIAGNOSIS — I10 Essential (primary) hypertension: Secondary | ICD-10-CM | POA: Diagnosis not present

## 2014-11-01 ENCOUNTER — Other Ambulatory Visit (HOSPITAL_COMMUNITY): Payer: Self-pay | Admitting: Internal Medicine

## 2014-11-01 DIAGNOSIS — Z0001 Encounter for general adult medical examination with abnormal findings: Secondary | ICD-10-CM | POA: Diagnosis not present

## 2014-11-01 DIAGNOSIS — E1165 Type 2 diabetes mellitus with hyperglycemia: Secondary | ICD-10-CM | POA: Diagnosis not present

## 2014-11-01 DIAGNOSIS — M159 Polyosteoarthritis, unspecified: Secondary | ICD-10-CM | POA: Diagnosis not present

## 2014-11-01 DIAGNOSIS — I10 Essential (primary) hypertension: Secondary | ICD-10-CM | POA: Diagnosis not present

## 2014-11-01 DIAGNOSIS — E119 Type 2 diabetes mellitus without complications: Secondary | ICD-10-CM | POA: Diagnosis not present

## 2014-11-01 DIAGNOSIS — Z1231 Encounter for screening mammogram for malignant neoplasm of breast: Secondary | ICD-10-CM

## 2014-11-08 ENCOUNTER — Ambulatory Visit (HOSPITAL_COMMUNITY)
Admission: RE | Admit: 2014-11-08 | Discharge: 2014-11-08 | Disposition: A | Discharge status: Home / Self Care | Payer: Commercial Managed Care - HMO | Source: Ambulatory Visit | Attending: Internal Medicine | Admitting: Internal Medicine

## 2014-11-08 ENCOUNTER — Other Ambulatory Visit (HOSPITAL_COMMUNITY): Payer: Self-pay | Admitting: Internal Medicine

## 2014-11-08 DIAGNOSIS — Z1231 Encounter for screening mammogram for malignant neoplasm of breast: Secondary | ICD-10-CM | POA: Diagnosis not present

## 2014-11-21 ENCOUNTER — Encounter: Payer: Self-pay | Admitting: *Deleted

## 2014-11-26 ENCOUNTER — Other Ambulatory Visit: Payer: Self-pay | Admitting: Women's Health

## 2014-12-05 ENCOUNTER — Encounter: Payer: Self-pay | Admitting: Women's Health

## 2014-12-05 ENCOUNTER — Other Ambulatory Visit (HOSPITAL_COMMUNITY)
Admission: RE | Admit: 2014-12-05 | Discharge: 2014-12-05 | Disposition: A | Discharge status: Home / Self Care | Payer: Commercial Managed Care - HMO | Source: Ambulatory Visit | Attending: Obstetrics & Gynecology | Admitting: Obstetrics & Gynecology

## 2014-12-05 ENCOUNTER — Ambulatory Visit (INDEPENDENT_AMBULATORY_CARE_PROVIDER_SITE_OTHER): Payer: Commercial Managed Care - HMO | Admitting: Women's Health

## 2014-12-05 ENCOUNTER — Other Ambulatory Visit (INDEPENDENT_AMBULATORY_CARE_PROVIDER_SITE_OTHER): Payer: Commercial Managed Care - HMO | Admitting: Women's Health

## 2014-12-05 VITALS — BP 142/80 | HR 88 | Ht 69.0 in | Wt 308.0 lb

## 2014-12-05 DIAGNOSIS — Z124 Encounter for screening for malignant neoplasm of cervix: Secondary | ICD-10-CM | POA: Diagnosis not present

## 2014-12-05 DIAGNOSIS — Z1151 Encounter for screening for human papillomavirus (HPV): Secondary | ICD-10-CM | POA: Diagnosis not present

## 2014-12-05 DIAGNOSIS — Z1211 Encounter for screening for malignant neoplasm of colon: Secondary | ICD-10-CM

## 2014-12-05 LAB — HEMOCCULT GUIAC POC 1CARD (OFFICE): Fecal Occult Blood, POC: NEGATIVE

## 2014-12-05 NOTE — Progress Notes (Signed)
Patient ID: [name] Price, female   DOB: 05-01-1950, 64 y.o.   MRN: [id number] Subjective:   DARE SPILLMAN is a 64 y.o. [id number] African American female here for a pap smear only, had annual physical exam w/ dr. Legrand Rams.  No LMP recorded. Patient is postmenopausal.    Current complaints: none PCP: dr. Legrand Rams       Screening labs done by pcp  Social History: Sexual: heterosexual Marital Status: married Living situation: with spouse Occupation: retired, on disability d/t knees Tobacco/alcohol: no tobacco or etoh Illicit drugs: no history of illicit drug use  The following portions of the patient's history were reviewed and updated as appropriate: allergies, current medications, past family history, past medical history, past social history, past surgical history and problem list.  Past Medical History Past Medical History  Diagnosis Date  . Diabetes mellitus   . HTN (hypertension)   . High cholesterol   . Arthritis   . Joint disorder   . Morbid obesity due to excess calories   . Plantar fascial fibromatosis   . Polyosteoarthritis     Past Surgical History Past Surgical History  Procedure Laterality Date  . Total knee arthroplasty  11/30/2011    Procedure: TOTAL KNEE ARTHROPLASTY;  Surgeon: Carole Civil, MD;  Location: [facility];  Service: Orthopedics;  Laterality: Right;  . Colonoscopy N/A 10/13/2012    Procedure: COLONOSCOPY;  Surgeon: Danie Binder, MD;  Location: [facility];  Service: Endoscopy;  Laterality: N/A;  8:30 AM    Gynecologic History No obstetric history on file.  No LMP recorded. Patient is postmenopausal. Contraception: post menopausal status Last Pap: 'years ago'. Results were: normal, no h/o abnormal paps Last mammogram: 11/09/14. Results were: normal Last TCS: 10/13/12, 2 polypectomies, mild diverticulosis, mod sized internal hemorrhoids  Obstetric History OB History  Gravida Para Term Preterm AB SAB TAB Ectopic Multiple Living  2 2 2       2     #  Outcome Date GA Lbr Len/2nd Weight Sex Delivery Anes PTL Lv  2 Term 1976 [redacted]w[redacted]d   M Vag-Spont   Y  1 Term 65 [redacted]w[redacted]d   F Vag-Spont   Y      Current Medications Current Outpatient Prescriptions on File Prior to Visit  Medication Sig Dispense Refill  . aspirin EC 81 MG tablet Take 81 mg by mouth daily.     Marland Kitchen glipiZIDE (GLUCOTROL) 5 MG tablet Take 5 mg by mouth 2 (two) times daily before a meal.    . HYDROcodone-acetaminophen (NORCO) 5-325 MG per tablet Take 1 tablet by mouth every 6 (six) hours as needed for pain. 56 tablet 5  . lisinopril-hydrochlorothiazide (PRINZIDE,ZESTORETIC) 20-25 MG per tablet Take 2 tablets by mouth daily.     . metFORMIN (GLUCOPHAGE) 500 MG tablet Take 1,000 mg by mouth 2 (two) times daily with a meal.    . simvastatin (ZOCOR) 20 MG tablet Take 20 mg by mouth every evening.    . Alum & Mag Hydroxide-Simeth (ANTACID ANTI-GAS PO) Take 1 tablet by mouth daily.    . cetirizine (ZYRTEC) 10 MG tablet Take 10 mg by mouth daily.    . traMADol-acetaminophen (ULTRACET) 37.5-325 MG per tablet Take 1 tablet by mouth every 6 (six) hours as needed.    . triamcinolone cream (KENALOG) 0.5 % Apply 1 application topically 3 (three) times daily.     No current facility-administered medications on file prior to visit.    Review of Systems Patient denies any  headaches, blurred vision, shortness of breath, chest pain, abdominal pain, problems with bowel movements, urination, or intercourse.  Objective:  BP 142/80 mmHg  Pulse 88  Ht 5\' 9"  (1.753 m)  Wt 308 lb (139.708 kg)  BMI 45.46 kg/m2 Physical Exam  General:  Well developed, well nourished, no acute distress. She is alert and oriented x3. Skin:  Warm and dry Pelvic:  External genitalia is normal in appearance.  The vagina is normal in appearance, smooth w/ loss of rugae c/w postmenopausal status. The cervix is bulbous, no CMT.  Thin prep pap is done w/  HR HPV cotesting. Uterus is felt to be normal size, shape, and contour.  No  adnexal masses or tenderness noted. Rectal: Good sphincter tone, no polyps, or hemorrhoids felt.  Hemoccult: neg Psych:  She has a normal mood and affect  Assessment:   Pap smear only   Plan:  F/U prn  If pap neg w/ -HRHPV she will not need any further paps Mammogram in 32yr or sooner if problems Colonoscopy in 2024 per GI or sooner if problems  Tawnya Crook CNM, [facility] 12/05/2014 10:45 AM

## 2014-12-06 LAB — CYTOLOGY - PAP

## 2014-12-11 ENCOUNTER — Ambulatory Visit: Payer: BC Managed Care – PPO | Admitting: Orthopedic Surgery

## 2015-01-03 ENCOUNTER — Ambulatory Visit (INDEPENDENT_AMBULATORY_CARE_PROVIDER_SITE_OTHER): Payer: Commercial Managed Care - HMO

## 2015-01-03 ENCOUNTER — Ambulatory Visit (INDEPENDENT_AMBULATORY_CARE_PROVIDER_SITE_OTHER): Payer: Commercial Managed Care - HMO | Admitting: Orthopedic Surgery

## 2015-01-03 VITALS — BP 123/79 | Ht 69.0 in | Wt 308.0 lb

## 2015-01-03 DIAGNOSIS — Z9889 Other specified postprocedural states: Secondary | ICD-10-CM

## 2015-01-03 DIAGNOSIS — Z96651 Presence of right artificial knee joint: Secondary | ICD-10-CM | POA: Diagnosis not present

## 2015-01-03 DIAGNOSIS — M129 Arthropathy, unspecified: Secondary | ICD-10-CM | POA: Diagnosis not present

## 2015-01-03 DIAGNOSIS — M1710 Unilateral primary osteoarthritis, unspecified knee: Secondary | ICD-10-CM

## 2015-01-03 NOTE — Progress Notes (Signed)
Patient ID: [name] Price, female   DOB: 04/12/1951, 64 y.o.   MRN: [id number]  Post op annual TKA   Chief Complaint  Patient presents with  . Follow-up    yearly follow up + xray RT TKA, DOS 11/30/11    HPI [name] Price is a 64 y.o. female.   She had knee replacement surgery she is here for annual follow-up she has no complaints  Review of systems is negative related to the musculoskeletal system   Past Medical History  Diagnosis Date  . Diabetes mellitus   . HTN (hypertension)   . High cholesterol   . Arthritis   . Joint disorder   . Morbid obesity due to excess calories   . Plantar fascial fibromatosis   . Polyosteoarthritis     Past Surgical History  Procedure Laterality Date  . Total knee arthroplasty  11/30/2011    Procedure: TOTAL KNEE ARTHROPLASTY;  Surgeon: Carole Civil, MD;  Location: [facility];  Service: Orthopedics;  Laterality: Right;  . Colonoscopy N/A 10/13/2012    Procedure: COLONOSCOPY;  Surgeon: Danie Binder, MD;  Location: [facility];  Service: Endoscopy;  Laterality: N/A;  8:30 AM     No Known Allergies  Current Outpatient Prescriptions  Medication Sig Dispense Refill  . glipiZIDE (GLUCOTROL) 5 MG tablet Take 5 mg by mouth 2 (two) times daily before a meal.    . HYDROcodone-acetaminophen (NORCO) 5-325 MG per tablet Take 1 tablet by mouth every 6 (six) hours as needed for pain. 56 tablet 5  . lisinopril-hydrochlorothiazide (PRINZIDE,ZESTORETIC) 20-25 MG per tablet Take 2 tablets by mouth daily.     . metFORMIN (GLUCOPHAGE) 500 MG tablet Take 1,000 mg by mouth 2 (two) times daily with a meal.    . simvastatin (ZOCOR) 20 MG tablet Take 20 mg by mouth every evening.    . Alum & Mag Hydroxide-Simeth (ANTACID ANTI-GAS PO) Take 1 tablet by mouth daily.    Marland Kitchen aspirin EC 81 MG tablet Take 81 mg by mouth daily.     . cetirizine (ZYRTEC) 10 MG tablet Take 10 mg by mouth daily.    . traMADol-acetaminophen (ULTRACET) 37.5-325 MG per tablet Take 1 tablet  by mouth every 6 (six) hours as needed.    . triamcinolone cream (KENALOG) 0.5 % Apply 1 application topically 3 (three) times daily.     No current facility-administered medications for this visit.    Review of Systems Review of Systems   Physical Exam Blood pressure 123/79, height 5\' 9"  (1.753 m), weight 308 lb (139.708 kg).   Gen. appearance is normal there are no congenital abnormalities   The patient is oriented 3   Mood and affect are normal   Ambulation is without assistive device   Knee inspection reveals a well-healed incision with no swelling   Knee flexion  120  Stability in the anteroposterior plane is normal as well as in the medial lateral plane  Motor exam reveals full extension without extensor lag   Data Reviewed KNEE XRAYS :  Normal-appearing postop total knee film Smith & Nephew implant  Assessment S/P TKA   Plan     one-year follow-up x-rays annual films again        [employer] 01/03/2015, 11:08 AM

## 2015-01-31 DIAGNOSIS — Z23 Encounter for immunization: Secondary | ICD-10-CM | POA: Diagnosis not present

## 2015-01-31 DIAGNOSIS — E1165 Type 2 diabetes mellitus with hyperglycemia: Secondary | ICD-10-CM | POA: Diagnosis not present

## 2015-01-31 DIAGNOSIS — M159 Polyosteoarthritis, unspecified: Secondary | ICD-10-CM | POA: Diagnosis not present

## 2015-01-31 DIAGNOSIS — I10 Essential (primary) hypertension: Secondary | ICD-10-CM | POA: Diagnosis not present

## 2015-05-09 DIAGNOSIS — E1165 Type 2 diabetes mellitus with hyperglycemia: Secondary | ICD-10-CM | POA: Diagnosis not present

## 2015-05-09 DIAGNOSIS — M170 Bilateral primary osteoarthritis of knee: Secondary | ICD-10-CM | POA: Diagnosis not present

## 2015-05-09 DIAGNOSIS — I10 Essential (primary) hypertension: Secondary | ICD-10-CM | POA: Diagnosis not present

## 2015-06-03 DIAGNOSIS — E109 Type 1 diabetes mellitus without complications: Secondary | ICD-10-CM | POA: Diagnosis not present

## 2015-06-03 DIAGNOSIS — I10 Essential (primary) hypertension: Secondary | ICD-10-CM | POA: Diagnosis not present

## 2015-06-03 DIAGNOSIS — H5210 Myopia, unspecified eye: Secondary | ICD-10-CM | POA: Diagnosis not present

## 2015-06-03 DIAGNOSIS — H25813 Combined forms of age-related cataract, bilateral: Secondary | ICD-10-CM | POA: Diagnosis not present

## 2015-08-08 DIAGNOSIS — I10 Essential (primary) hypertension: Secondary | ICD-10-CM | POA: Diagnosis not present

## 2015-08-08 DIAGNOSIS — M170 Bilateral primary osteoarthritis of knee: Secondary | ICD-10-CM | POA: Diagnosis not present

## 2015-08-08 DIAGNOSIS — E1165 Type 2 diabetes mellitus with hyperglycemia: Secondary | ICD-10-CM | POA: Diagnosis not present

## 2015-11-07 DIAGNOSIS — E119 Type 2 diabetes mellitus without complications: Secondary | ICD-10-CM | POA: Diagnosis not present

## 2015-11-07 DIAGNOSIS — Z0000 Encounter for general adult medical examination without abnormal findings: Secondary | ICD-10-CM | POA: Diagnosis not present

## 2015-11-07 DIAGNOSIS — I10 Essential (primary) hypertension: Secondary | ICD-10-CM | POA: Diagnosis not present

## 2015-11-07 DIAGNOSIS — M159 Polyosteoarthritis, unspecified: Secondary | ICD-10-CM | POA: Diagnosis not present

## 2015-11-07 DIAGNOSIS — E1165 Type 2 diabetes mellitus with hyperglycemia: Secondary | ICD-10-CM | POA: Diagnosis not present

## 2015-12-26 DIAGNOSIS — E1165 Type 2 diabetes mellitus with hyperglycemia: Secondary | ICD-10-CM | POA: Diagnosis not present

## 2015-12-26 DIAGNOSIS — M159 Polyosteoarthritis, unspecified: Secondary | ICD-10-CM | POA: Diagnosis not present

## 2015-12-26 DIAGNOSIS — Z23 Encounter for immunization: Secondary | ICD-10-CM | POA: Diagnosis not present

## 2015-12-26 DIAGNOSIS — I10 Essential (primary) hypertension: Secondary | ICD-10-CM | POA: Diagnosis not present

## 2016-01-01 ENCOUNTER — Encounter: Payer: Self-pay | Admitting: Orthopedic Surgery

## 2016-01-09 ENCOUNTER — Ambulatory Visit: Payer: Commercial Managed Care - HMO | Admitting: Orthopedic Surgery

## 2016-01-13 ENCOUNTER — Ambulatory Visit (INDEPENDENT_AMBULATORY_CARE_PROVIDER_SITE_OTHER): Payer: Commercial Managed Care - HMO | Admitting: Orthopedic Surgery

## 2016-01-13 ENCOUNTER — Ambulatory Visit (INDEPENDENT_AMBULATORY_CARE_PROVIDER_SITE_OTHER): Payer: Commercial Managed Care - HMO

## 2016-01-13 ENCOUNTER — Encounter: Payer: Self-pay | Admitting: Orthopedic Surgery

## 2016-01-13 VITALS — BP 114/68 | HR 91 | Ht 69.0 in | Wt 309.0 lb

## 2016-01-13 DIAGNOSIS — M1710 Unilateral primary osteoarthritis, unspecified knee: Secondary | ICD-10-CM | POA: Diagnosis not present

## 2016-01-13 NOTE — Progress Notes (Signed)
Patient ID: [name] Price, female   DOB: [birth year], 65 y.o.   MRN: [id number]  Follow-up  Chief Complaint  Patient presents with  . Follow-up    Yearly recheck on right knee replacement, DOS 11-30-11.    BP 114/68   Pulse 91   Ht 5\' 9"  (1.753 m)   Wt (!) 309 lb (140.2 kg)   BMI 45.63 kg/m   Follow-up annual for your postoperative right total knee with Tamala Julian & Nephew implant  No complaints to the right knee  Review of systems left knee pain with symptoms of osteoarthritis dull aching pain not ready for knee replacement per her request  Vitals as measured above  She has a good gait occasional use of a cane. Good flexion of 125 extension is full knee is stable quadriceps strength is excellent alignment is good scans intact pulses are good sensation is normal  Return 1 year for repeat film  Today's film shows stable implant without loosening

## 2016-01-15 ENCOUNTER — Other Ambulatory Visit (HOSPITAL_COMMUNITY): Payer: Self-pay | Admitting: Internal Medicine

## 2016-01-15 DIAGNOSIS — Z1231 Encounter for screening mammogram for malignant neoplasm of breast: Secondary | ICD-10-CM

## 2016-01-23 ENCOUNTER — Ambulatory Visit (HOSPITAL_COMMUNITY)
Admission: RE | Admit: 2016-01-23 | Discharge: 2016-01-23 | Disposition: A | Discharge status: Home / Self Care | Payer: Commercial Managed Care - HMO | Source: Ambulatory Visit | Attending: Internal Medicine | Admitting: Internal Medicine

## 2016-01-23 DIAGNOSIS — Z1231 Encounter for screening mammogram for malignant neoplasm of breast: Secondary | ICD-10-CM | POA: Diagnosis not present

## 2016-02-27 DIAGNOSIS — I10 Essential (primary) hypertension: Secondary | ICD-10-CM | POA: Diagnosis not present

## 2016-02-27 DIAGNOSIS — E1165 Type 2 diabetes mellitus with hyperglycemia: Secondary | ICD-10-CM | POA: Diagnosis not present

## 2016-02-27 DIAGNOSIS — Z23 Encounter for immunization: Secondary | ICD-10-CM | POA: Diagnosis not present

## 2016-02-27 DIAGNOSIS — M159 Polyosteoarthritis, unspecified: Secondary | ICD-10-CM | POA: Diagnosis not present

## 2016-06-05 DIAGNOSIS — M159 Polyosteoarthritis, unspecified: Secondary | ICD-10-CM | POA: Diagnosis not present

## 2016-06-05 DIAGNOSIS — E1165 Type 2 diabetes mellitus with hyperglycemia: Secondary | ICD-10-CM | POA: Diagnosis not present

## 2016-06-05 DIAGNOSIS — I10 Essential (primary) hypertension: Secondary | ICD-10-CM | POA: Diagnosis not present

## 2016-09-04 DIAGNOSIS — I10 Essential (primary) hypertension: Secondary | ICD-10-CM | POA: Diagnosis not present

## 2016-09-04 DIAGNOSIS — M259 Joint disorder, unspecified: Secondary | ICD-10-CM | POA: Diagnosis not present

## 2016-09-04 DIAGNOSIS — E1165 Type 2 diabetes mellitus with hyperglycemia: Secondary | ICD-10-CM | POA: Diagnosis not present

## 2016-11-12 DIAGNOSIS — E109 Type 1 diabetes mellitus without complications: Secondary | ICD-10-CM | POA: Diagnosis not present

## 2016-11-12 DIAGNOSIS — H5210 Myopia, unspecified eye: Secondary | ICD-10-CM | POA: Diagnosis not present

## 2016-11-12 DIAGNOSIS — H25813 Combined forms of age-related cataract, bilateral: Secondary | ICD-10-CM | POA: Diagnosis not present

## 2016-11-25 DIAGNOSIS — Z0100 Encounter for examination of eyes and vision without abnormal findings: Secondary | ICD-10-CM | POA: Diagnosis not present

## 2016-12-04 ENCOUNTER — Other Ambulatory Visit (HOSPITAL_COMMUNITY): Payer: Self-pay | Admitting: Internal Medicine

## 2016-12-04 DIAGNOSIS — M159 Polyosteoarthritis, unspecified: Secondary | ICD-10-CM | POA: Diagnosis not present

## 2016-12-04 DIAGNOSIS — I10 Essential (primary) hypertension: Secondary | ICD-10-CM | POA: Diagnosis not present

## 2016-12-04 DIAGNOSIS — E1165 Type 2 diabetes mellitus with hyperglycemia: Secondary | ICD-10-CM | POA: Diagnosis not present

## 2016-12-04 DIAGNOSIS — Z780 Asymptomatic menopausal state: Secondary | ICD-10-CM

## 2016-12-04 DIAGNOSIS — Z1389 Encounter for screening for other disorder: Secondary | ICD-10-CM | POA: Diagnosis not present

## 2016-12-10 ENCOUNTER — Ambulatory Visit (HOSPITAL_COMMUNITY)
Admission: RE | Admit: 2016-12-10 | Discharge: 2016-12-10 | Disposition: A | Discharge status: Home / Self Care | Payer: Medicare HMO | Source: Ambulatory Visit | Attending: Internal Medicine | Admitting: Internal Medicine

## 2016-12-10 ENCOUNTER — Other Ambulatory Visit (HOSPITAL_COMMUNITY): Payer: Self-pay | Admitting: Internal Medicine

## 2016-12-10 DIAGNOSIS — Z780 Asymptomatic menopausal state: Secondary | ICD-10-CM | POA: Insufficient documentation

## 2016-12-10 DIAGNOSIS — Z13820 Encounter for screening for osteoporosis: Secondary | ICD-10-CM | POA: Diagnosis not present

## 2017-01-11 ENCOUNTER — Encounter: Payer: Self-pay | Admitting: Orthopedic Surgery

## 2017-01-12 ENCOUNTER — Ambulatory Visit: Payer: Commercial Managed Care - HMO | Admitting: Orthopedic Surgery

## 2017-01-13 ENCOUNTER — Other Ambulatory Visit (HOSPITAL_COMMUNITY): Payer: Self-pay | Admitting: Internal Medicine

## 2017-01-13 DIAGNOSIS — Z1231 Encounter for screening mammogram for malignant neoplasm of breast: Secondary | ICD-10-CM

## 2017-01-25 ENCOUNTER — Ambulatory Visit (HOSPITAL_COMMUNITY)
Admission: RE | Admit: 2017-01-25 | Discharge: 2017-01-25 | Disposition: A | Discharge status: Home / Self Care | Payer: Medicare HMO | Source: Ambulatory Visit | Attending: Internal Medicine | Admitting: Internal Medicine

## 2017-01-25 DIAGNOSIS — Z1231 Encounter for screening mammogram for malignant neoplasm of breast: Secondary | ICD-10-CM | POA: Insufficient documentation

## 2017-01-27 ENCOUNTER — Ambulatory Visit (INDEPENDENT_AMBULATORY_CARE_PROVIDER_SITE_OTHER): Payer: Medicare HMO | Admitting: Orthopedic Surgery

## 2017-01-27 ENCOUNTER — Other Ambulatory Visit (HOSPITAL_COMMUNITY): Payer: Self-pay | Admitting: Internal Medicine

## 2017-01-27 ENCOUNTER — Ambulatory Visit (INDEPENDENT_AMBULATORY_CARE_PROVIDER_SITE_OTHER): Payer: Medicare HMO

## 2017-01-27 ENCOUNTER — Encounter: Payer: Self-pay | Admitting: Orthopedic Surgery

## 2017-01-27 VITALS — BP 145/74 | HR 75 | Ht 69.0 in | Wt 314.0 lb

## 2017-01-27 DIAGNOSIS — R928 Other abnormal and inconclusive findings on diagnostic imaging of breast: Secondary | ICD-10-CM

## 2017-01-27 DIAGNOSIS — Z96651 Presence of right artificial knee joint: Secondary | ICD-10-CM

## 2017-01-27 DIAGNOSIS — N6310 Unspecified lump in the right breast, unspecified quadrant: Secondary | ICD-10-CM

## 2017-01-27 NOTE — Progress Notes (Signed)
Progress Note   Patient ID: [name] Price, female   DOB: 1950/04/30, 66 y.o.   MRN: [id number]  Chief Complaint  Patient presents with  . Post-op Follow-up    yearly follow up for TKR 8/771     66 years old status post total knee 5 years ago. She is doing well with her right knee no issues       Review of Systems  Constitutional: Negative for fever.  Respiratory: Negative for shortness of breath.   Cardiovascular: Negative for chest pain.  Skin: Negative.   Neurological: Negative for tingling and sensory change.   Current Meds  Medication Sig  . Alum & Mag Hydroxide-Simeth (ANTACID ANTI-GAS PO) Take 1 tablet by mouth daily.  Marland Kitchen aspirin EC 81 MG tablet Take 81 mg by mouth daily.   . cetirizine (ZYRTEC) 10 MG tablet Take 10 mg by mouth daily.  Marland Kitchen glipiZIDE (GLUCOTROL) 5 MG tablet Take 5 mg by mouth 2 (two) times daily before a meal.  . insulin glargine (LANTUS) 100 UNIT/ML injection Inject 50 Units into the skin at bedtime.  . Insulin Syringe-Needle U-100 (INSULIN SYRINGE .5CC/31GX5/16") 31G X 5/16" 0.5 ML MISC   . lisinopril-hydrochlorothiazide (PRINZIDE,ZESTORETIC) 20-25 MG per tablet Take 2 tablets by mouth daily.   . metFORMIN (GLUCOPHAGE) 1000 MG tablet   . simvastatin (ZOCOR) 20 MG tablet Take 20 mg by mouth every evening.  . triamcinolone cream (KENALOG) 0.5 % Apply 1 application topically 3 (three) times daily.    No Known Allergies   BP (!) 145/74   Pulse 75   Ht 5\' 9"  (1.753 m)   Wt (!) 314 lb (142.4 kg)   BMI 46.37 kg/m   Physical Exam   Gen. appearance the patient's appearance is normal with normal grooming and  hygiene The patient is oriented to person place and time Mood and affect are normal  BP (!) 145/74   Pulse 75   Ht 5\' 9"  (1.753 m)   Wt (!) 314 lb (142.4 kg)   BMI 46.37 kg/m  Ortho Exam  She's walking with a cane is not related to her knee just her overall general health and weight distribution  Her knee bends 120 comes to full  extension stable and the collateral and anterior posterior directions neurovascular exam is intact  Medical decision-making Encounter Diagnosis  Name Primary?  . Status post total right knee replacement 11/30/2011 Yes    Today's x-ray shows a stable knee prosthesis no loosening no complications noted.  Meds ordered this encounter  Medications  . Insulin Syringe-Needle U-100 (INSULIN SYRINGE .5CC/31GX5/16") 31G X 5/16" 0.5 ML MISC  . metFORMIN (GLUCOPHAGE) 1000 MG tablet    Follow-up in a year Arther Abbott, MD 01/27/2017 3:51 PM

## 2017-02-02 ENCOUNTER — Ambulatory Visit (HOSPITAL_COMMUNITY)
Admission: RE | Admit: 2017-02-02 | Discharge: 2017-02-02 | Disposition: A | Discharge status: Home / Self Care | Payer: Medicare HMO | Source: Ambulatory Visit | Attending: Internal Medicine | Admitting: Internal Medicine

## 2017-02-02 DIAGNOSIS — N6489 Other specified disorders of breast: Secondary | ICD-10-CM | POA: Diagnosis not present

## 2017-02-02 DIAGNOSIS — R928 Other abnormal and inconclusive findings on diagnostic imaging of breast: Secondary | ICD-10-CM

## 2017-03-11 DIAGNOSIS — I10 Essential (primary) hypertension: Secondary | ICD-10-CM | POA: Diagnosis not present

## 2017-03-11 DIAGNOSIS — E119 Type 2 diabetes mellitus without complications: Secondary | ICD-10-CM | POA: Diagnosis not present

## 2017-03-11 DIAGNOSIS — Z23 Encounter for immunization: Secondary | ICD-10-CM | POA: Diagnosis not present

## 2017-03-11 DIAGNOSIS — M159 Polyosteoarthritis, unspecified: Secondary | ICD-10-CM | POA: Diagnosis not present

## 2017-06-10 DIAGNOSIS — M159 Polyosteoarthritis, unspecified: Secondary | ICD-10-CM | POA: Diagnosis not present

## 2017-06-10 DIAGNOSIS — I10 Essential (primary) hypertension: Secondary | ICD-10-CM | POA: Diagnosis not present

## 2017-06-10 DIAGNOSIS — E119 Type 2 diabetes mellitus without complications: Secondary | ICD-10-CM | POA: Diagnosis not present

## 2017-09-09 DIAGNOSIS — Z1389 Encounter for screening for other disorder: Secondary | ICD-10-CM | POA: Diagnosis not present

## 2017-09-09 DIAGNOSIS — E1165 Type 2 diabetes mellitus with hyperglycemia: Secondary | ICD-10-CM | POA: Diagnosis not present

## 2017-09-09 DIAGNOSIS — M259 Joint disorder, unspecified: Secondary | ICD-10-CM | POA: Diagnosis not present

## 2017-09-09 DIAGNOSIS — E119 Type 2 diabetes mellitus without complications: Secondary | ICD-10-CM | POA: Diagnosis not present

## 2017-09-09 DIAGNOSIS — M159 Polyosteoarthritis, unspecified: Secondary | ICD-10-CM | POA: Diagnosis not present

## 2017-09-09 DIAGNOSIS — I10 Essential (primary) hypertension: Secondary | ICD-10-CM | POA: Diagnosis not present

## 2017-09-09 DIAGNOSIS — Z1331 Encounter for screening for depression: Secondary | ICD-10-CM | POA: Diagnosis not present

## 2017-11-01 ENCOUNTER — Encounter (HOSPITAL_COMMUNITY): Payer: Self-pay | Admitting: Emergency Medicine

## 2017-11-01 ENCOUNTER — Telehealth: Payer: Self-pay | Admitting: Orthopedic Surgery

## 2017-11-01 ENCOUNTER — Other Ambulatory Visit: Payer: Self-pay

## 2017-11-01 ENCOUNTER — Emergency Department (HOSPITAL_COMMUNITY)
Admission: EM | Admit: 2017-11-01 | Discharge: 2017-11-01 | Disposition: A | Discharge status: Home / Self Care | Payer: Medicare HMO | Attending: Emergency Medicine | Admitting: Emergency Medicine

## 2017-11-01 ENCOUNTER — Emergency Department (HOSPITAL_COMMUNITY): Payer: Medicare HMO

## 2017-11-01 DIAGNOSIS — Z79899 Other long term (current) drug therapy: Secondary | ICD-10-CM | POA: Insufficient documentation

## 2017-11-01 DIAGNOSIS — W010XXA Fall on same level from slipping, tripping and stumbling without subsequent striking against object, initial encounter: Secondary | ICD-10-CM | POA: Diagnosis not present

## 2017-11-01 DIAGNOSIS — Y999 Unspecified external cause status: Secondary | ICD-10-CM | POA: Insufficient documentation

## 2017-11-01 DIAGNOSIS — I10 Essential (primary) hypertension: Secondary | ICD-10-CM | POA: Diagnosis not present

## 2017-11-01 DIAGNOSIS — M25561 Pain in right knee: Secondary | ICD-10-CM | POA: Diagnosis not present

## 2017-11-01 DIAGNOSIS — S8991XA Unspecified injury of right lower leg, initial encounter: Secondary | ICD-10-CM | POA: Diagnosis present

## 2017-11-01 DIAGNOSIS — S8001XA Contusion of right knee, initial encounter: Secondary | ICD-10-CM | POA: Diagnosis not present

## 2017-11-01 DIAGNOSIS — Z794 Long term (current) use of insulin: Secondary | ICD-10-CM | POA: Diagnosis not present

## 2017-11-01 DIAGNOSIS — Y9301 Activity, walking, marching and hiking: Secondary | ICD-10-CM | POA: Insufficient documentation

## 2017-11-01 DIAGNOSIS — Y92019 Unspecified place in single-family (private) house as the place of occurrence of the external cause: Secondary | ICD-10-CM | POA: Insufficient documentation

## 2017-11-01 DIAGNOSIS — E119 Type 2 diabetes mellitus without complications: Secondary | ICD-10-CM | POA: Insufficient documentation

## 2017-11-01 NOTE — Discharge Instructions (Addendum)
Apply ice packs on and off to your knee.  You may wear the Ace wrap as needed for support.  Follow-up with Dr. Ruthe Mannan office in 1 week if your symptoms are not improving.

## 2017-11-01 NOTE — Telephone Encounter (Signed)
Patient called, states fell this morning, and is concerned about her right knee, for which she had undergone a total knee replacement 11/30/11. Relayed office protocol of recommendation to go on to [facility] Emergency room for work-up, and to then call our office for further advise.  Said she has iced her knee, and will go on.

## 2017-11-01 NOTE — ED Notes (Signed)
Warm blanket given to patient

## 2017-11-01 NOTE — ED Triage Notes (Signed)
Pt reports tripping and falling on RT knee this morning. Pt had knee replacement on RT knee 5 years ago. Pt ambulatory with cane.

## 2017-11-03 NOTE — ED Provider Notes (Signed)
[facility] EMERGENCY DEPARTMENT Provider Note   CSN: [id number] Arrival date & time: 11/01/17  1011     History   Chief Complaint Chief Complaint  Patient presents with  . Fall    HPI [name] Price is a 67 y.o. female.  HPI  [name] Price is a 67 y.o. female who presents to the Emergency Department complaining of a mechanical fall shortly before ER arrival.  She states that she tripped inside her home, falling on her hands and knees with pain only to the right knee.  She complains of swelling and bruising to her knee with pain associated with movement.  She walks with a cane at baseline.  She denies neck, back pain, head injury or LOC.  Takes 81 mg ASA daily, but no other anti-coagulants.     Past Medical History:  Diagnosis Date  . Arthritis   . Diabetes mellitus   . High cholesterol   . HTN (hypertension)   . Joint disorder   . Morbid obesity due to excess calories ([place])   . Plantar fascial fibromatosis   . Polyosteoarthritis     Patient Active Problem List   Diagnosis Date Noted  . Osteoarthrosis, unspecified whether generalized or localized, lower leg 12/06/2013  . S/P total knee arthroplasty right 11/30/11 11/24/2012  . Chest pain 05/24/2012  . Diabetes ([place]) 05/24/2012  . Essential hypertension 05/24/2012  . Hypercholesterolemia 05/24/2012  . Right leg weakness 01/19/2012  . Difficulty walking 01/19/2012  . Pain in knee 01/19/2012  . Balance disorder 01/19/2012  . S/P total knee replacement 01/12/2012  . OA (osteoarthritis) of knee 06/04/2011    Past Surgical History:  Procedure Laterality Date  . COLONOSCOPY N/A 10/13/2012   Procedure: COLONOSCOPY;  Surgeon: Danie Binder, MD;  Location: [facility] ENDO SUITE;  Service: Endoscopy;  Laterality: N/A;  8:30 AM  . TOTAL KNEE ARTHROPLASTY  11/30/2011   Procedure: TOTAL KNEE ARTHROPLASTY;  Surgeon: Carole Civil, MD;  Location: [facility] ORS;  Service: Orthopedics;  Laterality: Right;     OB History    Gravida    2   Para  2   Term  2   Preterm      AB      Living  2     SAB      TAB      Ectopic      Multiple      Live Births  2            Home Medications    Prior to Admission medications   Medication Sig Start Date End Date Taking? Authorizing Provider  Alum & Mag Hydroxide-Simeth (ANTACID ANTI-GAS PO) Take 1 tablet by mouth daily.    [provider]  aspirin EC 81 MG tablet Take 81 mg by mouth daily.     [provider]  cetirizine (ZYRTEC) 10 MG tablet Take 10 mg by mouth daily.    [provider]  glipiZIDE (GLUCOTROL) 5 MG tablet Take 5 mg by mouth 2 (two) times daily before a meal.    [provider]  insulin glargine (LANTUS) 100 UNIT/ML injection Inject 50 Units into the skin at bedtime.    [provider]  Insulin Syringe-Needle U-100 (INSULIN SYRINGE .5CC/31GX5/16") 31G X 5/16" 0.5 ML MISC  01/19/17   [provider]  lisinopril-hydrochlorothiazide (PRINZIDE,ZESTORETIC) 20-25 MG per tablet Take 2 tablets by mouth daily.     [provider]  metFORMIN (GLUCOPHAGE) 1000 MG tablet  11/19/16   [provider]  simvastatin (ZOCOR) 20 MG tablet Take 20 mg by mouth every evening.    [provider]  triamcinolone cream (KENALOG) 0.5 % Apply 1 application topically 3 (three) times daily.    [provider]    Family History Family History  Problem Relation Age of Onset  . Coronary artery disease Mother   . Diabetes Sister   . Heart attack Brother   . Hypertension Father   . Diabetes Father   . Heart disease Unknown   . Arthritis Unknown   . Lung disease Unknown   . Kidney disease Unknown   . Diabetes Son     Social History Social History   Tobacco Use  . Smoking status: Never Smoker  . Smokeless tobacco: Never Used  Substance Use Topics  . Alcohol use: No  . Drug use: No     Allergies   Patient has no known allergies.   Review of Systems Review of Systems   Constitutional: Negative for chills and fever.  Eyes: Negative for visual disturbance.  Respiratory: Negative for shortness of breath.   Cardiovascular: Negative for chest pain.  Gastrointestinal: Negative for abdominal pain, nausea and vomiting.  Genitourinary: Negative for flank pain and hematuria.  Musculoskeletal: Positive for arthralgias (right knee pain) and joint swelling. Negative for back pain and neck pain.  Skin: Negative for color change and wound.  Neurological: Negative for dizziness, syncope, speech difficulty, weakness and headaches.  Psychiatric/Behavioral: Negative for confusion.  All other systems reviewed and are negative.    Physical Exam Updated Vital Signs BP 139/65 (BP Location: Right Arm)   Pulse 88   Temp 97.7 F (36.5 C) (Oral)   Resp 16   Ht 5\' 8"  (1.727 m)   Wt 133.8 kg (295 lb)   SpO2 95%   BMI 44.85 kg/m   Physical Exam  Constitutional: She appears well-developed and well-nourished. No distress.  HENT:  Head: Normocephalic and atraumatic.  Eyes: Pupils are equal, round, and reactive to light. EOM are normal.  Neck: Normal range of motion.  Cardiovascular: Normal rate, regular rhythm and intact distal pulses.  Pulmonary/Chest: Effort normal and breath sounds normal.  Musculoskeletal: She exhibits edema and tenderness. She exhibits no deformity.  ttp of the medial right knee.  Mild localized edema and ecchymosis.  No erythema, effusion, or step-off deformity.  Pt has full ROM of the knee.  Neg drawer sign.  No pain or edema distally.  Compartments soft.  Neurological: She is alert. She has normal strength. No sensory deficit. Gait normal. GCS eye subscore is 4. GCS verbal subscore is 5. GCS motor subscore is 6.  CN III-XII grossly intact  Skin: Skin is warm and dry. Capillary refill takes less than 2 seconds. No erythema.  Psychiatric: Thought content normal.  Nursing note and vitals reviewed.    ED Treatments / Results  Labs (all labs  ordered are listed, but only abnormal results are displayed) Labs Reviewed - No data to display  EKG None  Radiology Dg Knee Complete 4 Views Right  Result Date: 11/01/2017 CLINICAL DATA:  Fall at home.  Right knee pain. EXAM: RIGHT KNEE - COMPLETE 4+ VIEW COMPARISON:  Right knee radiographs 01/28/2015 FINDINGS: Right total knee arthroplasty is noted. The knee is located. No acute bone or soft tissue abnormalities are present. There is no significant joint effusion. IMPRESSION: Negative right knee radiographs. Electronically Signed   By: San Morelle M.D.   On: 11/01/2017 11:02  Procedures Procedures (including critical care time)  Medications Ordered in ED Medications - No data to display   Initial Impression / Assessment and Plan / ED Course  I have reviewed the triage vital signs and the nursing notes.  Pertinent labs & imaging results that were available during my care of the patient were reviewed by me and considered in my medical decision making (see chart for details).     Pt with mechanical fall landing on both knees with right knee pain.  Xray neg for dislocation or fx.  NV intact.  Pt denies other injuries.    Knee sleeve applied.  Pt walks with cane at baseline.  She agrees to close orthopedic f/u in one week if not improving.  Has pain medication at home.  Return precautions discussed.    Final Clinical Impressions(s) / ED Diagnoses   Final diagnoses:  Contusion of right knee, initial encounter    ED Discharge Orders    None       Kem Parkinson, PA-C 11/03/17 1436    Sherwood Gambler, MD 11/03/17 [phone]

## 2017-12-09 DIAGNOSIS — I10 Essential (primary) hypertension: Secondary | ICD-10-CM | POA: Diagnosis not present

## 2017-12-09 DIAGNOSIS — Z0001 Encounter for general adult medical examination with abnormal findings: Secondary | ICD-10-CM | POA: Diagnosis not present

## 2017-12-09 DIAGNOSIS — Z1389 Encounter for screening for other disorder: Secondary | ICD-10-CM | POA: Diagnosis not present

## 2017-12-09 DIAGNOSIS — Z1331 Encounter for screening for depression: Secondary | ICD-10-CM | POA: Diagnosis not present

## 2017-12-09 DIAGNOSIS — E119 Type 2 diabetes mellitus without complications: Secondary | ICD-10-CM | POA: Diagnosis not present

## 2017-12-27 ENCOUNTER — Ambulatory Visit (INDEPENDENT_AMBULATORY_CARE_PROVIDER_SITE_OTHER): Payer: Medicare HMO

## 2017-12-27 ENCOUNTER — Encounter: Payer: Self-pay | Admitting: Orthopedic Surgery

## 2017-12-27 ENCOUNTER — Ambulatory Visit: Payer: Medicare HMO | Admitting: Orthopedic Surgery

## 2017-12-27 VITALS — BP 156/95 | HR 82 | Ht 68.0 in | Wt 299.0 lb

## 2017-12-27 DIAGNOSIS — M25562 Pain in left knee: Secondary | ICD-10-CM

## 2017-12-27 DIAGNOSIS — Z96651 Presence of right artificial knee joint: Secondary | ICD-10-CM

## 2017-12-27 MED ORDER — IBUPROFEN 800 MG PO TABS: 800.0000 mg | ORAL_TABLET | Freq: Three times a day (TID) | ORAL | 1 refills | Status: DC | PRN

## 2017-12-27 NOTE — Patient Instructions (Signed)
Continue with weight loss efforts you are doing good with the loss of 2 pounds  Continue your diabetic diet as you are doing  Continue ice heat and ibuprofen for your shoulder and knee

## 2017-12-27 NOTE — Progress Notes (Signed)
NEW PROBLEM ESTABLISHED PATIENT   Chief Complaint  Patient presents with  . Knee Pain    Bilat knee pain after falls First fall 7/22 Second August    67 year old female status post right total knee had a couple of falls a month ago comes in with bilateral knee pain and right shoulder pain  She says she was doing quite well until she had the falls.  Since that time the falls have caused her right and left knee to hurt.  The right knee hurts on the medial side had some swelling and bruising which are getting better  The left knee hurt in the patellofemoral region but is also improving.  She says at first she noted a lot of swelling and tenderness around the joint but at it has subsequently resolved  Right shoulder anterolateral and upper arm pain painful range of motion decreased range of motion mild to moderate pain treated with IcyHot ice and ibuprofen  She says the shoulder does hurt if she tries to lift something heavy and notices some mild weakness in the shoulder   Review of Systems  Constitutional: Negative for fever.  Respiratory: Negative for shortness of breath.   Cardiovascular: Negative for chest pain.  Musculoskeletal: Positive for falls and joint pain.  Skin: Negative.   Neurological: Negative for tingling.     Past Medical History:  Diagnosis Date  . Arthritis   . Diabetes mellitus   . High cholesterol   . HTN (hypertension)   . Joint disorder   . Morbid obesity due to excess calories ([place])   . Plantar fascial fibromatosis   . Polyosteoarthritis     Past Surgical History:  Procedure Laterality Date  . COLONOSCOPY N/A 10/13/2012   Procedure: COLONOSCOPY;  Surgeon: Danie Binder, MD;  Location: [facility] END[facility];  Service: Endoscopy;  Laterality: N/A;  8:30 AM  . TOTAL KNEE ARTHROPLASTY  11/30/2011   Procedure: TOTAL KNEE ARTHROPLASTY;  Surgeon: Carole Civil, MD;  Location: [facility] ORS;  Service: Orthopedics;  Laterality: Right;    Family History  Problem  Relation Age of Onset  . Coronary artery disease Mother   . Diabetes Sister   . Heart attack Brother   . Hypertension Father   . Diabetes Father   . Heart disease Unknown   . Arthritis Unknown   . Lung disease Unknown   . Kidney disease Unknown   . Diabetes Son    Social History   Tobacco Use  . Smoking status: Never Smoker  . Smokeless tobacco: Never Used  Substance Use Topics  . Alcohol use: No  . Drug use: No    No Known Allergies  Current Meds  Medication Sig  . Alum & Mag Hydroxide-Simeth (ANTACID ANTI-GAS PO) Take 1 tablet by mouth daily.  Marland Kitchen aspirin EC 81 MG tablet Take 81 mg by mouth daily.   . cetirizine (ZYRTEC) 10 MG tablet Take 10 mg by mouth daily.  Marland Kitchen glipiZIDE (GLUCOTROL) 5 MG tablet Take 5 mg by mouth 2 (two) times daily before a meal.  . insulin glargine (LANTUS) 100 UNIT/ML injection Inject 50 Units into the skin at bedtime.  . Insulin Syringe-Needle U-100 (INSULIN SYRINGE .5CC/31GX5/16") 31G X 5/16" 0.5 ML MISC   . lisinopril-hydrochlorothiazide (PRINZIDE,ZESTORETIC) 20-25 MG per tablet Take 2 tablets by mouth daily.   . metFORMIN (GLUCOPHAGE) 1000 MG tablet   . simvastatin (ZOCOR) 20 MG tablet Take 20 mg by mouth every evening.  . triamcinolone cream (KENALOG) 0.5 % Apply  1 application topically 3 (three) times daily.    BP (!) 156/95   Pulse 82   Ht 5\' 8"  (1.727 m)   Wt 299 lb (135.6 kg)   BMI 45.46 kg/m   Physical Exam  Constitutional: She is oriented to person, place, and time. She appears well-developed and well-nourished.  Neurological: She is alert and oriented to person, place, and time.  Psychiatric: She has a normal mood and affect. Judgment normal.  Vitals reviewed.   Ortho Exam  Right shoulder active range of motion 90 degrees abduction passive 110 active flexion 90 active extension normal passive flexion 150 drop test negative shoulder stable skin warm dry intact distal pulses are normal and sensation is normal  Right knee  well-healed surgical incision no tenderness flexion is 125 degrees total knee is stable in all planes extension power was normal skin incision well-healed without a rash  Left knee periarticular tenderness no swelling flexion arc is 120 degrees knee is stable in all planes quadricep strength is normal skin is intact without rash distal pulses and sensation are normal    MEDICAL DECISION SECTION  Xrays were done at [facility] took 4 views of the right knee prosthesis is noted to be in place without complication no fracture no loosening that I can see on the film and independent interpretation is that the x-ray shows a stable prosthesis without loosening  Second set of x-rays left knee taken in the office which show no fracture dislocation but severe arthritis with varus alignment, see report   Encounter Diagnoses  Name Primary?  . Status post total right knee replacement Yes  . Acute pain of left knee     PLAN: (Rx., injectx, surgery, frx, mri/ct) Patient will continue with ice and topical medications for the shoulder I ordered some ibuprofen for her  She will be seen on an annual basis for her right total knee  Her left knee will be seen on an as-needed basis  Right shoulder we will see her if she does not improve  No orders of the defined types were placed in this encounter.   Arther Abbott, MD  12/27/2017 12:01 PM

## 2018-01-17 DIAGNOSIS — E119 Type 2 diabetes mellitus without complications: Secondary | ICD-10-CM | POA: Diagnosis not present

## 2018-01-17 DIAGNOSIS — H25813 Combined forms of age-related cataract, bilateral: Secondary | ICD-10-CM | POA: Diagnosis not present

## 2018-01-19 ENCOUNTER — Other Ambulatory Visit (HOSPITAL_COMMUNITY): Payer: Self-pay | Admitting: Internal Medicine

## 2018-01-19 DIAGNOSIS — Z1231 Encounter for screening mammogram for malignant neoplasm of breast: Secondary | ICD-10-CM

## 2018-01-26 ENCOUNTER — Ambulatory Visit: Payer: Self-pay | Admitting: Orthopedic Surgery

## 2018-01-27 ENCOUNTER — Encounter (HOSPITAL_COMMUNITY): Payer: Self-pay

## 2018-01-27 ENCOUNTER — Ambulatory Visit (HOSPITAL_COMMUNITY)
Admission: RE | Admit: 2018-01-27 | Discharge: 2018-01-27 | Disposition: A | Discharge status: Home / Self Care | Payer: Medicare HMO | Source: Ambulatory Visit | Attending: Internal Medicine | Admitting: Internal Medicine

## 2018-01-27 DIAGNOSIS — Z1231 Encounter for screening mammogram for malignant neoplasm of breast: Secondary | ICD-10-CM | POA: Diagnosis not present

## 2018-03-03 ENCOUNTER — Other Ambulatory Visit: Payer: Self-pay | Admitting: Orthopedic Surgery

## 2018-03-07 DIAGNOSIS — M159 Polyosteoarthritis, unspecified: Secondary | ICD-10-CM | POA: Diagnosis not present

## 2018-03-07 DIAGNOSIS — Z23 Encounter for immunization: Secondary | ICD-10-CM | POA: Diagnosis not present

## 2018-03-07 DIAGNOSIS — E119 Type 2 diabetes mellitus without complications: Secondary | ICD-10-CM | POA: Diagnosis not present

## 2018-03-07 DIAGNOSIS — I10 Essential (primary) hypertension: Secondary | ICD-10-CM | POA: Diagnosis not present

## 2018-06-09 DIAGNOSIS — I10 Essential (primary) hypertension: Secondary | ICD-10-CM | POA: Diagnosis not present

## 2018-06-09 DIAGNOSIS — E119 Type 2 diabetes mellitus without complications: Secondary | ICD-10-CM | POA: Diagnosis not present

## 2018-06-09 DIAGNOSIS — M159 Polyosteoarthritis, unspecified: Secondary | ICD-10-CM | POA: Diagnosis not present

## 2018-10-18 DIAGNOSIS — I10 Essential (primary) hypertension: Secondary | ICD-10-CM | POA: Diagnosis not present

## 2018-10-18 DIAGNOSIS — M159 Polyosteoarthritis, unspecified: Secondary | ICD-10-CM | POA: Diagnosis not present

## 2018-10-18 DIAGNOSIS — E119 Type 2 diabetes mellitus without complications: Secondary | ICD-10-CM | POA: Diagnosis not present

## 2018-12-08 ENCOUNTER — Other Ambulatory Visit (HOSPITAL_COMMUNITY): Payer: Self-pay | Admitting: Internal Medicine

## 2018-12-08 DIAGNOSIS — E119 Type 2 diabetes mellitus without complications: Secondary | ICD-10-CM | POA: Diagnosis not present

## 2018-12-08 DIAGNOSIS — Z1231 Encounter for screening mammogram for malignant neoplasm of breast: Secondary | ICD-10-CM

## 2018-12-08 DIAGNOSIS — Z0000 Encounter for general adult medical examination without abnormal findings: Secondary | ICD-10-CM | POA: Diagnosis not present

## 2018-12-08 DIAGNOSIS — Z1389 Encounter for screening for other disorder: Secondary | ICD-10-CM | POA: Diagnosis not present

## 2018-12-08 DIAGNOSIS — I10 Essential (primary) hypertension: Secondary | ICD-10-CM | POA: Diagnosis not present

## 2018-12-08 DIAGNOSIS — E1165 Type 2 diabetes mellitus with hyperglycemia: Secondary | ICD-10-CM | POA: Diagnosis not present

## 2018-12-27 DIAGNOSIS — E119 Type 2 diabetes mellitus without complications: Secondary | ICD-10-CM | POA: Diagnosis not present

## 2018-12-27 DIAGNOSIS — I10 Essential (primary) hypertension: Secondary | ICD-10-CM | POA: Diagnosis not present

## 2018-12-27 DIAGNOSIS — Z0001 Encounter for general adult medical examination with abnormal findings: Secondary | ICD-10-CM | POA: Diagnosis not present

## 2018-12-28 ENCOUNTER — Ambulatory Visit: Payer: Medicare Other

## 2018-12-28 ENCOUNTER — Other Ambulatory Visit: Payer: Self-pay

## 2018-12-28 ENCOUNTER — Ambulatory Visit (INDEPENDENT_AMBULATORY_CARE_PROVIDER_SITE_OTHER): Payer: Medicare Other | Admitting: Orthopedic Surgery

## 2018-12-28 VITALS — BP 132/77 | HR 78 | Temp 97.1°F | Ht 68.0 in | Wt 290.0 lb

## 2018-12-28 DIAGNOSIS — S8002XA Contusion of left knee, initial encounter: Secondary | ICD-10-CM | POA: Diagnosis not present

## 2018-12-28 DIAGNOSIS — M25561 Pain in right knee: Secondary | ICD-10-CM

## 2018-12-28 DIAGNOSIS — M25562 Pain in left knee: Secondary | ICD-10-CM | POA: Diagnosis not present

## 2018-12-28 DIAGNOSIS — S8001XA Contusion of right knee, initial encounter: Secondary | ICD-10-CM | POA: Diagnosis not present

## 2018-12-28 NOTE — Progress Notes (Signed)
[name] Price  12/28/2018  HISTORY SECTION :  Chief Complaint  Patient presents with  . Knee Pain    Lef tknee pain, Fall on 12-26-18.   68 year old female with frequent falls over the last year recently fell on September 14 injured both knees complains of bilateral knee pain located over the anterior joint line and patellar tendon associated with a large amount of fluid anterior to the patella on the right and mild to moderate discomfort bilaterally treated so far with ice and Tylenol.  Review of Systems  Musculoskeletal: Positive for back pain and falls.  All other systems reviewed and are negative.    has a past medical history of Arthritis, Diabetes mellitus, High cholesterol, HTN (hypertension), Joint disorder, Morbid obesity due to excess calories ([place]), Plantar fascial fibromatosis, and Polyosteoarthritis.   Past Surgical History:  Procedure Laterality Date  . COLONOSCOPY N/A 10/13/2012   Procedure: COLONOSCOPY;  Surgeon: Danie Binder, MD;  Location: [facility];  Service: Endoscopy;  Laterality: N/A;  8:30 AM  . TOTAL KNEE ARTHROPLASTY  11/30/2011   Procedure: TOTAL KNEE ARTHROPLASTY;  Surgeon: Carole Civil, MD;  Location: [facility] ORS;  Service: Orthopedics;  Laterality: Right;     No Known Allergies   Current Outpatient Medications:  .  aspirin EC 81 MG tablet, Take 81 mg by mouth daily. , Disp: , Rfl:  .  glipiZIDE (GLUCOTROL) 5 MG tablet, Take 5 mg by mouth 2 (two) times daily before a meal., Disp: , Rfl:  .  ibuprofen (ADVIL,MOTRIN) 800 MG tablet, TAKE 1 TABLET(800 MG) BY MOUTH EVERY 8 HOURS AS NEEDED, Disp: 90 tablet, Rfl: 5 .  insulin glargine (LANTUS) 100 UNIT/ML injection, Inject 50 Units into the skin at bedtime., Disp: , Rfl:  .  Insulin Syringe-Needle U-100 (INSULIN SYRINGE .5CC/31GX5/16") 31G X 5/16" 0.5 ML MISC, , Disp: , Rfl:  .  lisinopril-hydrochlorothiazide (PRINZIDE,ZESTORETIC) 20-25 MG per tablet, Take 2 tablets by mouth daily. , Disp: , Rfl:  .   metFORMIN (GLUCOPHAGE) 1000 MG tablet, , Disp: , Rfl:  .  simvastatin (ZOCOR) 20 MG tablet, Take 20 mg by mouth every evening., Disp: , Rfl:  .  Alum & Mag Hydroxide-Simeth (ANTACID ANTI-GAS PO), Take 1 tablet by mouth daily., Disp: , Rfl:  .  cetirizine (ZYRTEC) 10 MG tablet, Take 10 mg by mouth daily., Disp: , Rfl:  .  triamcinolone cream (KENALOG) 0.5 %, Apply 1 application topically 3 (three) times daily., Disp: , Rfl:    PHYSICAL EXAM SECTION: 1) BP 132/77   Pulse 78   Temp (!) 97.1 F (36.2 C)   Ht 5\' 8"  (1.727 m)   Wt 290 lb (131.5 kg)   BMI 44.09 kg/m   Body mass index is 44.09 kg/m. General appearance: Well-developed well-nourished no gross deformities  2) Cardiovascular normal pulse and perfusion in the lower  extremities normal color without edema  3) Neurologically deep tendon reflexes are equal and normal, no sensation loss or deficits no pathologic reflexes  4) Psychological: Awake alert and oriented x3 mood and affect normal  5) Skin no lacerations or ulcerations no nodularity no palpable masses, no erythema or nodularity  6) Musculoskeletal:   Right knee is a noted a previous well-healed total knee incision with some anterior posterior instability no medial collateral ligament instability large fluid mass over the patella consistent with the bursal sac in the prepatellar region it does not look infected is not red it is tender quadriceps function is normal  Left knee quadriceps function is intact patellar is tender as is the patellar tendon knee is stable muscle tone and strength are normal no effusion is noted  MEDICAL DECISION SECTION:  Encounter Diagnoses  Name Primary?  . Pain in both knees, unspecified chronicity Yes  . Contusion of right knee, initial encounter   . Contusion of left knee, initial encounter     Imaging I ordered 2 films 1 of each knee  Right knee right knee prosthesis is intact no acute fracture see report  Left knee osteoarthritis  left knee no acute fracture  Plan:   Encounter Diagnoses  Name Primary?  . Pain in both knees, unspecified chronicity Yes  . Contusion of right knee, initial encounter   . Contusion of left knee, initial encounter      (Rx., Inj., surg., Frx, MRI/CT, XR:2) Recommend ice Ibuprofen Active range of motion Gradual resumption to normal activity with the use of a cane  Expect resolution in 3 to 4 weeks   1:56 PM Arther Abbott, MD  12/28/2018

## 2018-12-28 NOTE — Patient Instructions (Signed)
Recommend ice Ibuprofen Active range of motion Gradual resumption to normal activity with the use of a cane  Expect resolution in 3 to 4 weeks

## 2019-01-08 DIAGNOSIS — I10 Essential (primary) hypertension: Secondary | ICD-10-CM | POA: Diagnosis not present

## 2019-01-08 DIAGNOSIS — E1165 Type 2 diabetes mellitus with hyperglycemia: Secondary | ICD-10-CM | POA: Diagnosis not present

## 2019-01-11 DIAGNOSIS — Z23 Encounter for immunization: Secondary | ICD-10-CM | POA: Diagnosis not present

## 2019-01-30 ENCOUNTER — Ambulatory Visit (HOSPITAL_COMMUNITY)
Admission: RE | Admit: 2019-01-30 | Discharge: 2019-01-30 | Disposition: A | Discharge status: Home / Self Care | Payer: Medicare Other | Source: Ambulatory Visit | Attending: Internal Medicine | Admitting: Internal Medicine

## 2019-01-30 ENCOUNTER — Other Ambulatory Visit: Payer: Self-pay

## 2019-01-30 DIAGNOSIS — Z1231 Encounter for screening mammogram for malignant neoplasm of breast: Secondary | ICD-10-CM | POA: Diagnosis not present

## 2019-02-07 DIAGNOSIS — I10 Essential (primary) hypertension: Secondary | ICD-10-CM | POA: Diagnosis not present

## 2019-02-07 DIAGNOSIS — E1165 Type 2 diabetes mellitus with hyperglycemia: Secondary | ICD-10-CM | POA: Diagnosis not present

## 2019-03-10 DIAGNOSIS — I10 Essential (primary) hypertension: Secondary | ICD-10-CM | POA: Diagnosis not present

## 2019-03-10 DIAGNOSIS — E1165 Type 2 diabetes mellitus with hyperglycemia: Secondary | ICD-10-CM | POA: Diagnosis not present

## 2019-04-09 DIAGNOSIS — E1165 Type 2 diabetes mellitus with hyperglycemia: Secondary | ICD-10-CM | POA: Diagnosis not present

## 2019-04-09 DIAGNOSIS — I10 Essential (primary) hypertension: Secondary | ICD-10-CM | POA: Diagnosis not present

## 2019-04-27 DIAGNOSIS — E119 Type 2 diabetes mellitus without complications: Secondary | ICD-10-CM | POA: Diagnosis not present

## 2019-04-27 DIAGNOSIS — I10 Essential (primary) hypertension: Secondary | ICD-10-CM | POA: Diagnosis not present

## 2019-04-27 DIAGNOSIS — M159 Polyosteoarthritis, unspecified: Secondary | ICD-10-CM | POA: Diagnosis not present

## 2019-05-28 DIAGNOSIS — I10 Essential (primary) hypertension: Secondary | ICD-10-CM | POA: Diagnosis not present

## 2019-05-28 DIAGNOSIS — E1165 Type 2 diabetes mellitus with hyperglycemia: Secondary | ICD-10-CM | POA: Diagnosis not present

## 2019-06-13 ENCOUNTER — Ambulatory Visit: Payer: Medicare Other | Attending: Internal Medicine

## 2019-06-13 DIAGNOSIS — Z23 Encounter for immunization: Secondary | ICD-10-CM | POA: Insufficient documentation

## 2019-06-13 NOTE — Progress Notes (Signed)
   Covid-19 Vaccination Clinic  Name:  [name] Price    MRN: [id number] DOB: [birth year]  06/13/2019  [name] Price was observed post Covid-19 immunization for 15 minutes without incident. She was provided with Vaccine Information Sheet and instruction to access the V-Safe system.   [name] Price was instructed to call 911 with any severe reactions post vaccine: Marland Kitchen Difficulty breathing  . Swelling of face and throat  . A fast heartbeat  . A bad rash all over body  . Dizziness and weakness   Immunizations Administered    Name Date Dose VIS Date Route   Moderna COVID-19 Vaccine 06/13/2019  1:17 PM 0.5 mL 03/14/2019 Intramuscular   Manufacturer: Moderna   Lot: [id number]   [id number]

## 2019-06-25 DIAGNOSIS — I10 Essential (primary) hypertension: Secondary | ICD-10-CM | POA: Diagnosis not present

## 2019-06-25 DIAGNOSIS — E1165 Type 2 diabetes mellitus with hyperglycemia: Secondary | ICD-10-CM | POA: Diagnosis not present

## 2019-07-11 ENCOUNTER — Ambulatory Visit: Payer: Medicare Other | Attending: Internal Medicine

## 2019-07-11 DIAGNOSIS — Z23 Encounter for immunization: Secondary | ICD-10-CM

## 2019-07-11 NOTE — Progress Notes (Signed)
   Covid-19 Vaccination Clinic  Name:  [name] Price    MRN: [id number] DOB: 12-23-1950  07/11/2019  [name] was observed post Covid-19 immunization for 15 minutes without incident. She was provided with Vaccine Information Sheet and instruction to access the V-Safe system.   [name] was instructed to call 911 with any severe reactions post vaccine: Marland Kitchen Difficulty breathing  . Swelling of face and throat  . A fast heartbeat  . A bad rash all over body  . Dizziness and weakness   Immunizations Administered    Name Date Dose VIS Date Route   Moderna COVID-19 Vaccine 07/11/2019  1:13 PM 0.5 mL 03/14/2019 Intramuscular   Manufacturer: Moderna   Lot: [id number]   [id number]

## 2019-07-13 IMAGING — US US BREAST*R* LIMITED INC AXILLA
1 series · 2 of 2 positions shown · non-contrast
Comparison: 01/25/2017, 01/23/2016, 11/08/2014

ACR Breast Density Category a: The breast tissue is almost entirely
fatty.

CLINICAL DATA: 66-year-old patient recalled from recent 3D
screening mammogram for evaluation 2 possible masses in the inferior
right breast.

EXAM:
2D DIGITAL DIAGNOSTIC RIGHT MAMMOGRAM WITH ADJUNCT TOMO
ULTRASOUND RIGHT BREAST

[Series 1: us breast*right* limited inc axilla · 0.09mm/px · 2 of 2 slices shown]
[im 1/2]
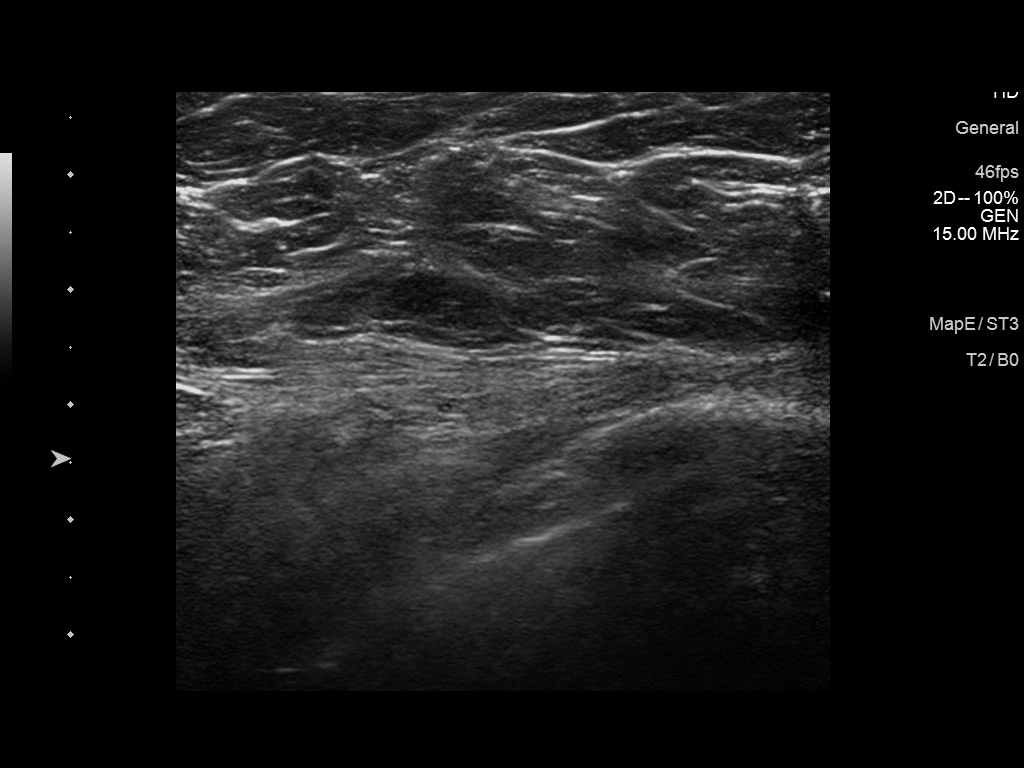
[im 2/2]
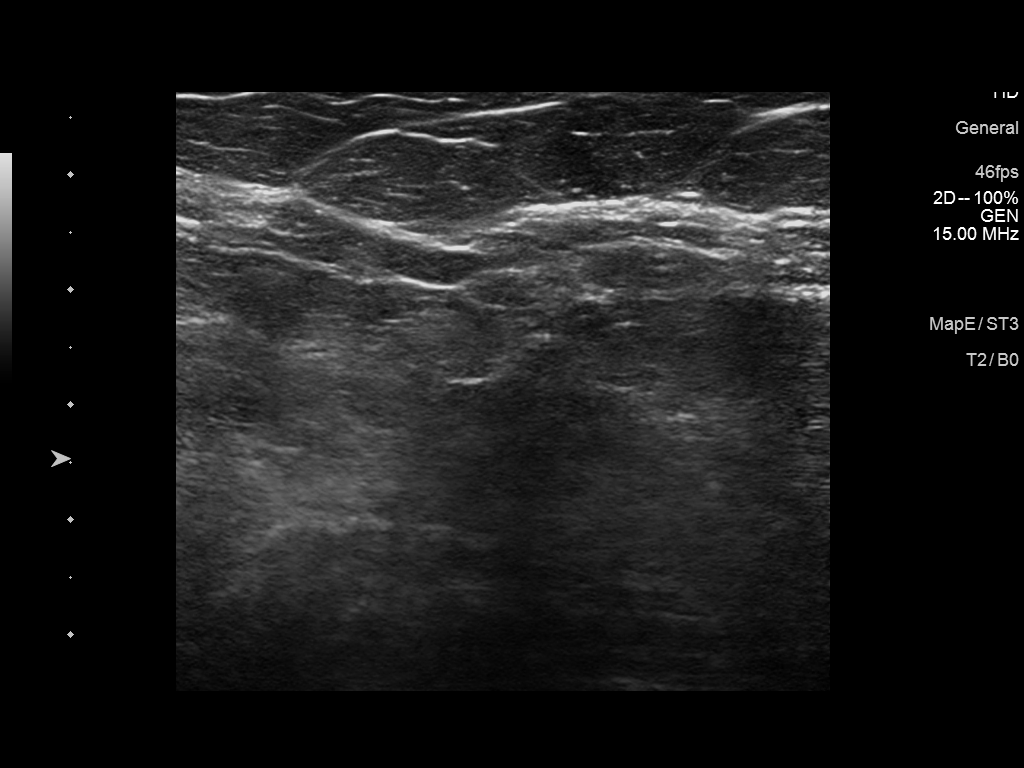

[2 of 2 positions shown; findings below may reference images not displayed]

FINDINGS: Focal spot compression views of the inferior retroareolar central
right breast shows 2 low-density nodules, approximately 5 and 6 mm
in size, respectively. When compared to prior 2D mammograms of
01/23/2016 and 11/08/2014, these low-density nodules appear
unchanged given differences in technique.

Targeted ultrasound is performed, showing normal fatty tissue in the
inferior central right breast. Ultrasound was performed of the
entire inferior right breast. No solid or cystic mass or abnormal
shadowing is identified. The low-density nodules seen on the
mammogram do not have a sonographic correlate.
IMPRESSION: Two subcentimeter low-density nodules the inferior right breast are
mammographically stable compared to 2D mammograms of 1460 and 4459.
The ultrasound is negative. No evidence of malignancy.

RECOMMENDATION:
Screening mammogram in one year.(Code:[id number])

I have discussed the findings and recommendations with the patient.
Results were also provided in writing at the conclusion of the
visit. If applicable, a reminder letter will be sent to the patient
regarding the next appointment.

BI-RADS CATEGORY  2: Benign.

## 2019-07-26 DIAGNOSIS — E1165 Type 2 diabetes mellitus with hyperglycemia: Secondary | ICD-10-CM | POA: Diagnosis not present

## 2019-07-26 DIAGNOSIS — M159 Polyosteoarthritis, unspecified: Secondary | ICD-10-CM | POA: Diagnosis not present

## 2019-08-25 DIAGNOSIS — E1165 Type 2 diabetes mellitus with hyperglycemia: Secondary | ICD-10-CM | POA: Diagnosis not present

## 2019-08-25 DIAGNOSIS — I10 Essential (primary) hypertension: Secondary | ICD-10-CM | POA: Diagnosis not present

## 2019-09-25 DIAGNOSIS — E1165 Type 2 diabetes mellitus with hyperglycemia: Secondary | ICD-10-CM | POA: Diagnosis not present

## 2019-09-25 DIAGNOSIS — I10 Essential (primary) hypertension: Secondary | ICD-10-CM | POA: Diagnosis not present

## 2019-10-19 DIAGNOSIS — E119 Type 2 diabetes mellitus without complications: Secondary | ICD-10-CM | POA: Diagnosis not present

## 2019-10-19 DIAGNOSIS — I10 Essential (primary) hypertension: Secondary | ICD-10-CM | POA: Diagnosis not present

## 2019-10-19 DIAGNOSIS — Z0001 Encounter for general adult medical examination with abnormal findings: Secondary | ICD-10-CM | POA: Diagnosis not present

## 2019-10-19 DIAGNOSIS — Z1389 Encounter for screening for other disorder: Secondary | ICD-10-CM | POA: Diagnosis not present

## 2019-11-01 DIAGNOSIS — I10 Essential (primary) hypertension: Secondary | ICD-10-CM | POA: Diagnosis not present

## 2019-11-01 DIAGNOSIS — E119 Type 2 diabetes mellitus without complications: Secondary | ICD-10-CM | POA: Diagnosis not present

## 2019-11-01 DIAGNOSIS — Z79899 Other long term (current) drug therapy: Secondary | ICD-10-CM | POA: Diagnosis not present

## 2019-11-01 DIAGNOSIS — Z0001 Encounter for general adult medical examination with abnormal findings: Secondary | ICD-10-CM | POA: Diagnosis not present

## 2019-11-19 DIAGNOSIS — M159 Polyosteoarthritis, unspecified: Secondary | ICD-10-CM | POA: Diagnosis not present

## 2019-11-19 DIAGNOSIS — E1165 Type 2 diabetes mellitus with hyperglycemia: Secondary | ICD-10-CM | POA: Diagnosis not present

## 2019-12-20 DIAGNOSIS — E1165 Type 2 diabetes mellitus with hyperglycemia: Secondary | ICD-10-CM | POA: Diagnosis not present

## 2019-12-20 DIAGNOSIS — I10 Essential (primary) hypertension: Secondary | ICD-10-CM | POA: Diagnosis not present

## 2020-01-03 ENCOUNTER — Other Ambulatory Visit (HOSPITAL_COMMUNITY): Payer: Self-pay | Admitting: Internal Medicine

## 2020-01-03 DIAGNOSIS — Z1231 Encounter for screening mammogram for malignant neoplasm of breast: Secondary | ICD-10-CM

## 2020-01-15 ENCOUNTER — Telehealth: Payer: Self-pay | Admitting: Orthopedic Surgery

## 2020-01-15 NOTE — Telephone Encounter (Signed)
At time of patient's last visit 12/28/2018 with Dr Aline Brochure, follow up was indicated as needed. Is patient to schedule a yearly Xray for right knee, status/post total knee replacement, which was done 11/30/11?

## 2020-01-15 NOTE — Telephone Encounter (Signed)
Yes, we can see her next year in August for another follow up

## 2020-01-16 NOTE — Telephone Encounter (Signed)
Called patient this morning to offer appointment. No voice mail set up.

## 2020-01-19 DIAGNOSIS — I10 Essential (primary) hypertension: Secondary | ICD-10-CM | POA: Diagnosis not present

## 2020-01-19 DIAGNOSIS — E1165 Type 2 diabetes mellitus with hyperglycemia: Secondary | ICD-10-CM | POA: Diagnosis not present

## 2020-01-22 NOTE — Telephone Encounter (Signed)
As of 01/19/20, patient was reached and scheduled for follow up appointment; aware.

## 2020-01-24 DIAGNOSIS — Z23 Encounter for immunization: Secondary | ICD-10-CM | POA: Diagnosis not present

## 2020-02-02 ENCOUNTER — Ambulatory Visit (HOSPITAL_COMMUNITY)
Admission: RE | Admit: 2020-02-02 | Discharge: 2020-02-02 | Disposition: A | Discharge status: Home / Self Care | Payer: Medicare Other | Source: Ambulatory Visit | Attending: Internal Medicine | Admitting: Internal Medicine

## 2020-02-02 ENCOUNTER — Other Ambulatory Visit: Payer: Self-pay

## 2020-02-02 DIAGNOSIS — Z1231 Encounter for screening mammogram for malignant neoplasm of breast: Secondary | ICD-10-CM | POA: Diagnosis not present

## 2020-02-19 DIAGNOSIS — I10 Essential (primary) hypertension: Secondary | ICD-10-CM | POA: Diagnosis not present

## 2020-02-19 DIAGNOSIS — E1165 Type 2 diabetes mellitus with hyperglycemia: Secondary | ICD-10-CM | POA: Diagnosis not present

## 2020-03-20 DIAGNOSIS — E1165 Type 2 diabetes mellitus with hyperglycemia: Secondary | ICD-10-CM | POA: Diagnosis not present

## 2020-03-20 DIAGNOSIS — I10 Essential (primary) hypertension: Secondary | ICD-10-CM | POA: Diagnosis not present

## 2020-04-25 DIAGNOSIS — I10 Essential (primary) hypertension: Secondary | ICD-10-CM | POA: Diagnosis not present

## 2020-04-25 DIAGNOSIS — E1165 Type 2 diabetes mellitus with hyperglycemia: Secondary | ICD-10-CM | POA: Diagnosis not present

## 2020-04-25 DIAGNOSIS — Z1389 Encounter for screening for other disorder: Secondary | ICD-10-CM | POA: Diagnosis not present

## 2020-04-25 DIAGNOSIS — Z0001 Encounter for general adult medical examination with abnormal findings: Secondary | ICD-10-CM | POA: Diagnosis not present

## 2020-05-09 DIAGNOSIS — Z0001 Encounter for general adult medical examination with abnormal findings: Secondary | ICD-10-CM | POA: Diagnosis not present

## 2020-05-09 DIAGNOSIS — E119 Type 2 diabetes mellitus without complications: Secondary | ICD-10-CM | POA: Diagnosis not present

## 2020-05-09 DIAGNOSIS — I10 Essential (primary) hypertension: Secondary | ICD-10-CM | POA: Diagnosis not present

## 2020-05-09 DIAGNOSIS — Z79899 Other long term (current) drug therapy: Secondary | ICD-10-CM | POA: Diagnosis not present

## 2020-05-16 DIAGNOSIS — E119 Type 2 diabetes mellitus without complications: Secondary | ICD-10-CM | POA: Diagnosis not present

## 2020-05-26 DIAGNOSIS — E1165 Type 2 diabetes mellitus with hyperglycemia: Secondary | ICD-10-CM | POA: Diagnosis not present

## 2020-05-26 DIAGNOSIS — I10 Essential (primary) hypertension: Secondary | ICD-10-CM | POA: Diagnosis not present

## 2020-06-23 DIAGNOSIS — I10 Essential (primary) hypertension: Secondary | ICD-10-CM | POA: Diagnosis not present

## 2020-06-23 DIAGNOSIS — E1165 Type 2 diabetes mellitus with hyperglycemia: Secondary | ICD-10-CM | POA: Diagnosis not present

## 2020-07-03 DIAGNOSIS — I10 Essential (primary) hypertension: Secondary | ICD-10-CM | POA: Diagnosis not present

## 2020-07-03 DIAGNOSIS — E1165 Type 2 diabetes mellitus with hyperglycemia: Secondary | ICD-10-CM | POA: Diagnosis not present

## 2020-07-24 DIAGNOSIS — I10 Essential (primary) hypertension: Secondary | ICD-10-CM | POA: Diagnosis not present

## 2020-07-24 DIAGNOSIS — E1165 Type 2 diabetes mellitus with hyperglycemia: Secondary | ICD-10-CM | POA: Diagnosis not present

## 2020-08-23 DIAGNOSIS — E1165 Type 2 diabetes mellitus with hyperglycemia: Secondary | ICD-10-CM | POA: Diagnosis not present

## 2020-08-23 DIAGNOSIS — I10 Essential (primary) hypertension: Secondary | ICD-10-CM | POA: Diagnosis not present

## 2020-09-23 DIAGNOSIS — E1165 Type 2 diabetes mellitus with hyperglycemia: Secondary | ICD-10-CM | POA: Diagnosis not present

## 2020-09-23 DIAGNOSIS — I10 Essential (primary) hypertension: Secondary | ICD-10-CM | POA: Diagnosis not present

## 2020-10-23 DIAGNOSIS — E1165 Type 2 diabetes mellitus with hyperglycemia: Secondary | ICD-10-CM | POA: Diagnosis not present

## 2020-10-23 DIAGNOSIS — I10 Essential (primary) hypertension: Secondary | ICD-10-CM | POA: Diagnosis not present

## 2020-10-23 DIAGNOSIS — Z96651 Presence of right artificial knee joint: Secondary | ICD-10-CM | POA: Diagnosis not present

## 2020-11-23 DIAGNOSIS — I10 Essential (primary) hypertension: Secondary | ICD-10-CM | POA: Diagnosis not present

## 2020-11-23 DIAGNOSIS — E1165 Type 2 diabetes mellitus with hyperglycemia: Secondary | ICD-10-CM | POA: Diagnosis not present

## 2020-11-25 ENCOUNTER — Ambulatory Visit: Payer: Medicare Other

## 2020-11-25 ENCOUNTER — Ambulatory Visit: Payer: Medicare Other | Admitting: Orthopedic Surgery

## 2020-11-25 ENCOUNTER — Other Ambulatory Visit: Payer: Self-pay

## 2020-11-25 ENCOUNTER — Encounter: Payer: Self-pay | Admitting: Orthopedic Surgery

## 2020-11-25 VITALS — BP 159/87 | HR 86 | Ht 68.0 in | Wt 260.0 lb

## 2020-11-25 DIAGNOSIS — M1711 Unilateral primary osteoarthritis, right knee: Secondary | ICD-10-CM | POA: Diagnosis not present

## 2020-11-25 DIAGNOSIS — M1712 Unilateral primary osteoarthritis, left knee: Secondary | ICD-10-CM

## 2020-11-25 DIAGNOSIS — Z96651 Presence of right artificial knee joint: Secondary | ICD-10-CM | POA: Diagnosis not present

## 2020-11-25 NOTE — Progress Notes (Signed)
Chief Complaint  Patient presents with   Post-op Follow-up    11/30/11 right knee replacement states doing well     70 years old had a right total knee back in 2013 denies any pain at this time doing well  She also has osteoarthritis of the left knee  Right knee has +5-125 degree range of motion left knee 0 to 125 degrees  Her x-ray of the right total knee shows no loosening with normal alignment  Patient is released to as needed follow-up

## 2020-12-24 DIAGNOSIS — E1165 Type 2 diabetes mellitus with hyperglycemia: Secondary | ICD-10-CM | POA: Diagnosis not present

## 2020-12-24 DIAGNOSIS — I10 Essential (primary) hypertension: Secondary | ICD-10-CM | POA: Diagnosis not present

## 2020-12-27 ENCOUNTER — Other Ambulatory Visit (HOSPITAL_COMMUNITY): Payer: Self-pay | Admitting: Internal Medicine

## 2020-12-27 DIAGNOSIS — Z1231 Encounter for screening mammogram for malignant neoplasm of breast: Secondary | ICD-10-CM

## 2021-01-16 DIAGNOSIS — Z23 Encounter for immunization: Secondary | ICD-10-CM | POA: Diagnosis not present

## 2021-01-23 DIAGNOSIS — I10 Essential (primary) hypertension: Secondary | ICD-10-CM | POA: Diagnosis not present

## 2021-01-23 DIAGNOSIS — E1165 Type 2 diabetes mellitus with hyperglycemia: Secondary | ICD-10-CM | POA: Diagnosis not present

## 2021-01-27 ENCOUNTER — Ambulatory Visit (HOSPITAL_COMMUNITY): Payer: Medicare Other

## 2021-02-03 ENCOUNTER — Ambulatory Visit (HOSPITAL_COMMUNITY)
Admission: RE | Admit: 2021-02-03 | Discharge: 2021-02-03 | Disposition: A | Discharge status: Home / Self Care | Payer: Medicare Other | Source: Ambulatory Visit | Attending: Internal Medicine | Admitting: Internal Medicine

## 2021-02-03 ENCOUNTER — Other Ambulatory Visit: Payer: Self-pay

## 2021-02-03 DIAGNOSIS — Z1231 Encounter for screening mammogram for malignant neoplasm of breast: Secondary | ICD-10-CM | POA: Diagnosis not present

## 2021-02-23 DIAGNOSIS — E1165 Type 2 diabetes mellitus with hyperglycemia: Secondary | ICD-10-CM | POA: Diagnosis not present

## 2021-02-23 DIAGNOSIS — I10 Essential (primary) hypertension: Secondary | ICD-10-CM | POA: Diagnosis not present

## 2021-03-25 DIAGNOSIS — E1165 Type 2 diabetes mellitus with hyperglycemia: Secondary | ICD-10-CM | POA: Diagnosis not present

## 2021-03-25 DIAGNOSIS — I10 Essential (primary) hypertension: Secondary | ICD-10-CM | POA: Diagnosis not present

## 2021-04-25 DIAGNOSIS — M159 Polyosteoarthritis, unspecified: Secondary | ICD-10-CM | POA: Diagnosis not present

## 2021-04-25 DIAGNOSIS — Z1389 Encounter for screening for other disorder: Secondary | ICD-10-CM | POA: Diagnosis not present

## 2021-04-25 DIAGNOSIS — E1165 Type 2 diabetes mellitus with hyperglycemia: Secondary | ICD-10-CM | POA: Diagnosis not present

## 2021-04-25 DIAGNOSIS — Z0001 Encounter for general adult medical examination with abnormal findings: Secondary | ICD-10-CM | POA: Diagnosis not present

## 2021-04-25 DIAGNOSIS — I10 Essential (primary) hypertension: Secondary | ICD-10-CM | POA: Diagnosis not present

## 2021-04-30 DIAGNOSIS — I10 Essential (primary) hypertension: Secondary | ICD-10-CM | POA: Diagnosis not present

## 2021-04-30 DIAGNOSIS — E1165 Type 2 diabetes mellitus with hyperglycemia: Secondary | ICD-10-CM | POA: Diagnosis not present

## 2021-04-30 DIAGNOSIS — Z0001 Encounter for general adult medical examination with abnormal findings: Secondary | ICD-10-CM | POA: Diagnosis not present

## 2021-05-22 DIAGNOSIS — E119 Type 2 diabetes mellitus without complications: Secondary | ICD-10-CM | POA: Diagnosis not present

## 2021-05-31 DIAGNOSIS — I10 Essential (primary) hypertension: Secondary | ICD-10-CM | POA: Diagnosis not present

## 2021-05-31 DIAGNOSIS — E1165 Type 2 diabetes mellitus with hyperglycemia: Secondary | ICD-10-CM | POA: Diagnosis not present

## 2021-06-20 DIAGNOSIS — I499 Cardiac arrhythmia, unspecified: Secondary | ICD-10-CM | POA: Diagnosis not present

## 2021-06-20 DIAGNOSIS — E1165 Type 2 diabetes mellitus with hyperglycemia: Secondary | ICD-10-CM | POA: Diagnosis not present

## 2021-06-20 DIAGNOSIS — I10 Essential (primary) hypertension: Secondary | ICD-10-CM | POA: Diagnosis not present

## 2021-06-23 ENCOUNTER — Ambulatory Visit (HOSPITAL_COMMUNITY)
Admission: RE | Admit: 2021-06-23 | Discharge: 2021-06-23 | Disposition: A | Discharge status: Home / Self Care | Payer: Medicare Other | Source: Ambulatory Visit | Attending: Internal Medicine | Admitting: Internal Medicine

## 2021-06-23 DIAGNOSIS — I499 Cardiac arrhythmia, unspecified: Secondary | ICD-10-CM | POA: Diagnosis not present

## 2021-07-21 DIAGNOSIS — E1165 Type 2 diabetes mellitus with hyperglycemia: Secondary | ICD-10-CM | POA: Diagnosis not present

## 2021-07-21 DIAGNOSIS — I10 Essential (primary) hypertension: Secondary | ICD-10-CM | POA: Diagnosis not present

## 2021-08-20 DIAGNOSIS — I10 Essential (primary) hypertension: Secondary | ICD-10-CM | POA: Diagnosis not present

## 2021-08-20 DIAGNOSIS — E1165 Type 2 diabetes mellitus with hyperglycemia: Secondary | ICD-10-CM | POA: Diagnosis not present

## 2021-09-20 DIAGNOSIS — E1165 Type 2 diabetes mellitus with hyperglycemia: Secondary | ICD-10-CM | POA: Diagnosis not present

## 2021-09-20 DIAGNOSIS — I10 Essential (primary) hypertension: Secondary | ICD-10-CM | POA: Diagnosis not present

## 2021-10-22 DIAGNOSIS — M159 Polyosteoarthritis, unspecified: Secondary | ICD-10-CM | POA: Diagnosis not present

## 2021-10-22 DIAGNOSIS — E1165 Type 2 diabetes mellitus with hyperglycemia: Secondary | ICD-10-CM | POA: Diagnosis not present

## 2021-10-22 DIAGNOSIS — I10 Essential (primary) hypertension: Secondary | ICD-10-CM | POA: Diagnosis not present

## 2021-11-22 DIAGNOSIS — I10 Essential (primary) hypertension: Secondary | ICD-10-CM | POA: Diagnosis not present

## 2021-12-23 DIAGNOSIS — I10 Essential (primary) hypertension: Secondary | ICD-10-CM | POA: Diagnosis not present

## 2021-12-23 DIAGNOSIS — E1165 Type 2 diabetes mellitus with hyperglycemia: Secondary | ICD-10-CM | POA: Diagnosis not present

## 2021-12-29 ENCOUNTER — Other Ambulatory Visit (HOSPITAL_COMMUNITY): Payer: Self-pay | Admitting: Internal Medicine

## 2021-12-29 DIAGNOSIS — Z1231 Encounter for screening mammogram for malignant neoplasm of breast: Secondary | ICD-10-CM

## 2022-01-22 DIAGNOSIS — E1165 Type 2 diabetes mellitus with hyperglycemia: Secondary | ICD-10-CM | POA: Diagnosis not present

## 2022-01-22 DIAGNOSIS — I10 Essential (primary) hypertension: Secondary | ICD-10-CM | POA: Diagnosis not present

## 2022-01-27 DIAGNOSIS — Z23 Encounter for immunization: Secondary | ICD-10-CM | POA: Diagnosis not present

## 2022-01-30 DIAGNOSIS — T783XXA Angioneurotic edema, initial encounter: Secondary | ICD-10-CM | POA: Diagnosis not present

## 2022-01-30 DIAGNOSIS — R030 Elevated blood-pressure reading, without diagnosis of hypertension: Secondary | ICD-10-CM | POA: Diagnosis not present

## 2022-02-05 ENCOUNTER — Ambulatory Visit (HOSPITAL_COMMUNITY): Payer: Medicare Other

## 2022-02-09 ENCOUNTER — Encounter (HOSPITAL_COMMUNITY): Payer: Self-pay

## 2022-02-09 ENCOUNTER — Ambulatory Visit (HOSPITAL_COMMUNITY)
Admission: RE | Admit: 2022-02-09 | Discharge: 2022-02-09 | Disposition: A | Discharge status: Home / Self Care | Payer: Medicare Other | Source: Ambulatory Visit | Attending: Internal Medicine | Admitting: Internal Medicine

## 2022-02-09 DIAGNOSIS — Z1231 Encounter for screening mammogram for malignant neoplasm of breast: Secondary | ICD-10-CM | POA: Diagnosis not present

## 2022-02-22 DIAGNOSIS — I10 Essential (primary) hypertension: Secondary | ICD-10-CM | POA: Diagnosis not present

## 2022-02-22 DIAGNOSIS — E1165 Type 2 diabetes mellitus with hyperglycemia: Secondary | ICD-10-CM | POA: Diagnosis not present

## 2022-03-24 DIAGNOSIS — E1165 Type 2 diabetes mellitus with hyperglycemia: Secondary | ICD-10-CM | POA: Diagnosis not present

## 2022-03-24 DIAGNOSIS — I10 Essential (primary) hypertension: Secondary | ICD-10-CM | POA: Diagnosis not present

## 2022-04-16 DIAGNOSIS — M159 Polyosteoarthritis, unspecified: Secondary | ICD-10-CM | POA: Diagnosis not present

## 2022-04-16 DIAGNOSIS — I10 Essential (primary) hypertension: Secondary | ICD-10-CM | POA: Diagnosis not present

## 2022-04-16 DIAGNOSIS — Z0001 Encounter for general adult medical examination with abnormal findings: Secondary | ICD-10-CM | POA: Diagnosis not present

## 2022-04-16 DIAGNOSIS — E1165 Type 2 diabetes mellitus with hyperglycemia: Secondary | ICD-10-CM | POA: Diagnosis not present

## 2022-04-16 DIAGNOSIS — Z96651 Presence of right artificial knee joint: Secondary | ICD-10-CM | POA: Diagnosis not present

## 2022-04-16 DIAGNOSIS — Z23 Encounter for immunization: Secondary | ICD-10-CM | POA: Diagnosis not present

## 2022-04-16 DIAGNOSIS — Z1389 Encounter for screening for other disorder: Secondary | ICD-10-CM | POA: Diagnosis not present

## 2022-04-23 DIAGNOSIS — E559 Vitamin D deficiency, unspecified: Secondary | ICD-10-CM | POA: Diagnosis not present

## 2022-04-23 DIAGNOSIS — I10 Essential (primary) hypertension: Secondary | ICD-10-CM | POA: Diagnosis not present

## 2022-04-23 DIAGNOSIS — E1165 Type 2 diabetes mellitus with hyperglycemia: Secondary | ICD-10-CM | POA: Diagnosis not present

## 2022-04-23 DIAGNOSIS — Z0001 Encounter for general adult medical examination with abnormal findings: Secondary | ICD-10-CM | POA: Diagnosis not present

## 2022-05-17 DIAGNOSIS — E1165 Type 2 diabetes mellitus with hyperglycemia: Secondary | ICD-10-CM | POA: Diagnosis not present

## 2022-05-17 DIAGNOSIS — I10 Essential (primary) hypertension: Secondary | ICD-10-CM | POA: Diagnosis not present

## 2022-09-03 ENCOUNTER — Encounter: Payer: Self-pay | Admitting: *Deleted

## 2022-11-02 ENCOUNTER — Encounter: Payer: Self-pay | Admitting: *Deleted

## 2022-11-02 ENCOUNTER — Telehealth: Payer: Self-pay | Admitting: *Deleted

## 2022-11-02 ENCOUNTER — Other Ambulatory Visit: Payer: Self-pay | Admitting: *Deleted

## 2022-11-02 ENCOUNTER — Ambulatory Visit: Payer: Medicare Other | Admitting: Gastroenterology

## 2022-11-02 ENCOUNTER — Encounter: Payer: Self-pay | Admitting: Gastroenterology

## 2022-11-02 VITALS — BP 131/67 | HR 86 | Temp 97.8°F | Ht 67.0 in | Wt 265.2 lb

## 2022-11-02 DIAGNOSIS — Z86010 Personal history of colonic polyps: Secondary | ICD-10-CM | POA: Diagnosis not present

## 2022-11-02 DIAGNOSIS — R198 Other specified symptoms and signs involving the digestive system and abdomen: Secondary | ICD-10-CM | POA: Diagnosis not present

## 2022-11-02 MED ORDER — PEG 3350-KCL-NA BICARB-NACL 420 G PO SOLR: 4000.0000 mL | Freq: Once | ORAL | 0 refills | Status: AC

## 2022-11-02 NOTE — Telephone Encounter (Signed)
Noted. Updated med list.

## 2022-11-02 NOTE — Patient Instructions (Signed)
Please call with name of laxative you take every morning.  Colonoscopy to be scheduled.

## 2022-11-02 NOTE — Progress Notes (Signed)
GI Office Note    Referring Provider: Benetta Spar* Primary Care Physician:  Benetta Spar, MD  Primary Gastroenterologist: formerly Dr. Darrick Penna  Chief Complaint   Chief Complaint  Patient presents with   New Patient (Initial Visit)    Pt here for recall colonoscopy visit     History of Present Illness   [name] Price is a 72 y.o. female presenting today to schedule colonoscopy. Last colonoscopy in 2014. H/O adenomatous colon polyps.   Patient reports change in bowels. Feels like her stools are thinner. Has hard time having BM if stools are formed. Certain meats (hamburger), cheese constipated her. Tries to avoid this although seems to tolerates ground beef prepared in other foods. Eats roasted foods. Avoids fried foods. Avoids bread except occasional sandwich. Water and one coffee daily.She has been taking a small pill laxative every morning. She takes a tbsp of MOM every afternoon. Tries to keep stools soft to make easier to pass.  Stools have been very soft to runny.  May have 3-4 stools per day sometimes more depending on what she eats.  No melena or rectal bleeding.  No abdominal pain.  No heartburn, vomiting.  Takes ibuprofen 800mg  BID for knee pain.   Colonoscopy 2014: -two polyps removed, tubular adenoma -mild diverticulosis -moderate sized internal hemorrhoids -next colonoscopy 10 years  Medications   Current Outpatient Medications  Medication Sig Dispense Refill   aspirin EC 81 MG tablet Take 81 mg by mouth daily.      ibuprofen (ADVIL,MOTRIN) 800 MG tablet TAKE 1 TABLET(800 MG) BY MOUTH EVERY 8 HOURS AS NEEDED 90 tablet 5   insulin glargine (LANTUS) 100 UNIT/ML injection Inject 50 Units into the skin at bedtime.     Insulin Syringe-Needle U-100 (INSULIN SYRINGE .5CC/31GX5/16") 31G X 5/16" 0.5 ML MISC      magnesium hydroxide (MILK OF MAGNESIA) 400 MG/5ML suspension Take 15 mLs by mouth daily.     metFORMIN (GLUCOPHAGE) 1000 MG tablet  Take 1,000 mg by mouth 2 (two) times daily with a meal.     Olmesartan-amLODIPine-HCTZ 20-5-12.5 MG TABS Take by mouth.     simvastatin (ZOCOR) 20 MG tablet Take 20 mg by mouth every evening.     No current facility-administered medications for this visit.    Allergies   Allergies as of 11/02/2022 - Review Complete 11/25/2020  Allergen Reaction Noted   Tramadol Swelling 11/02/2022    Past Medical History   Past Medical History:  Diagnosis Date   Arthritis    Diabetes mellitus    High cholesterol    HTN (hypertension)    Joint disorder    Morbid obesity due to excess calories (HCC)    Plantar fascial fibromatosis    Polyosteoarthritis     Past Surgical History   Past Surgical History:  Procedure Laterality Date   COLONOSCOPY N/A 10/13/2012   Procedure: COLONOSCOPY;  Surgeon: West Bali, MD;  Location: [facility];  Service: Endoscopy;  Laterality: N/A;  8:30 AM   TOTAL KNEE ARTHROPLASTY  11/30/2011   Procedure: TOTAL KNEE ARTHROPLASTY;  Surgeon: Vickki Hearing, MD;  Location: [facility];  Service: Orthopedics;  Laterality: Right;    Past Family History   Family History  Problem Relation Age of Onset   Coronary artery disease Mother    Diabetes Sister    Heart attack Brother    Hypertension Father    Diabetes Father    Heart disease Unknown    Arthritis  Unknown    Lung disease Unknown    Kidney disease Unknown    Diabetes Son     Past Social History   Social History   Socioeconomic History   Marital status: Widowed    Spouse name: Not on file   Number of children: Not on file   Years of education: college   Highest education level: Not on file  Occupational History   Occupation: Lobbyist: [employer]  Tobacco Use   Smoking status: Never   Smokeless tobacco: Never  Vaping Use   Vaping status: Never Used  Substance and Sexual Activity   Alcohol use: No   Drug use: No   Sexual activity: Yes  Other Topics Concern   Not on file  Social  History Narrative   Not on file   Social Determinants of Health   Financial Resource Strain: Not on file  Food Insecurity: Not on file  Transportation Needs: Not on file  Physical Activity: Not on file  Stress: Not on file  Social Connections: Not on file  Intimate Partner Violence: Not on file    Review of Systems   General: Negative for anorexia, weight loss, fever, chills, fatigue, weakness. Eyes: Negative for vision changes.  ENT: Negative for hoarseness, difficulty swallowing , nasal congestion. CV: Negative for chest pain, angina, palpitations, dyspnea on exertion, peripheral edema.  Respiratory: Negative for dyspnea at rest, dyspnea on exertion, cough, sputum, wheezing.  GI: See history of present illness. GU:  Negative for dysuria, hematuria, urinary incontinence, urinary frequency, nocturnal urination.  MS: Positive for knee pain, needs joint replacement but would have to go to rehab since her son and husband have passed and she would have no assistance at home. No low back pain.  Derm: Negative for rash or itching.  Neuro: Negative for weakness, abnormal sensation, seizure, frequent headaches, memory loss,  confusion.  Psych: Negative for anxiety, depression, suicidal ideation, hallucinations.  Endo: Negative for unusual weight change.  Heme: Negative for bruising or bleeding. Allergy: Negative for rash or hives.  Physical Exam   BP 131/67   Pulse 86   Temp 97.8 F (36.6 C)   Ht 5\' 7"  (1.702 m)   Wt 265 lb 3.2 oz (120.3 kg)   BMI 41.54 kg/m    General: Well-nourished, well-developed in no acute distress.  Head: Normocephalic, atraumatic.   Eyes: Conjunctiva pink, no icterus. Mouth: Oropharyngeal mucosa moist and pink   Neck: Supple without thyromegaly, masses, or lymphadenopathy.  Lungs: Clear to auscultation bilaterally.  Heart: Regular rate and rhythm, no murmurs rubs or gallops.  Abdomen: Bowel sounds are normal, nontender, nondistended, no  hepatosplenomegaly or masses,  no abdominal bruits or hernia, no rebound or guarding.   Rectal:  not performed Extremities: No lower extremity edema. No clubbing or deformities.  Neuro: Alert and oriented x 4 , grossly normal neurologically.  Skin: Warm and dry, no rash or jaundice.   Psych: Alert and cooperative, normal mood and affect.  Labs   None available  Imaging Studies   No results found.  Assessment   *Change in bowel habits *H/O adenomatous colon polyps  Has noted change in stool caliber, having difficulty passing stool unless her stools are very soft.  Taking a laxative every morning and milk of magnesia every evening.  Avoiding constipating foods.  Recommend updated colonoscopy at this time to rule out stricture, malignancy.   PLAN   Colonoscopy in the near future with Dr. Marletta Lor.  ASA 3.  I have discussed the risks, alternatives, benefits with regards to but not limited to the risk of reaction to medication, bleeding, infection, perforation and the patient is agreeable to proceed. Written consent to be obtained. Patient to call back with name of laxative that she is taking.    Leanna Battles. Melvyn Neth, MHS, PA-C [facility]

## 2022-11-02 NOTE — H&P (View-Only) (Signed)
 GI Office Note    Referring Provider: Benetta Spar* Primary Care Physician:  Benetta Spar, MD  Primary Gastroenterologist: formerly Dr. Darrick Penna  Chief Complaint   Chief Complaint  Patient presents with   New Patient (Initial Visit)    Pt here for recall colonoscopy visit     History of Present Illness   [name] Price is a 72 y.o. female presenting today to schedule colonoscopy. Last colonoscopy in 2014. H/O adenomatous colon polyps.   Patient reports change in bowels. Feels like her stools are thinner. Has hard time having BM if stools are formed. Certain meats (hamburger), cheese constipated her. Tries to avoid this although seems to tolerates ground beef prepared in other foods. Eats roasted foods. Avoids fried foods. Avoids bread except occasional sandwich. Water and one coffee daily.She has been taking a small pill laxative every morning. She takes a tbsp of MOM every afternoon. Tries to keep stools soft to make easier to pass.  Stools have been very soft to runny.  May have 3-4 stools per day sometimes more depending on what she eats.  No melena or rectal bleeding.  No abdominal pain.  No heartburn, vomiting.  Takes ibuprofen 800mg  BID for knee pain.   Colonoscopy 2014: -two polyps removed, tubular adenoma -mild diverticulosis -moderate sized internal hemorrhoids -next colonoscopy 10 years  Medications   Current Outpatient Medications  Medication Sig Dispense Refill   aspirin EC 81 MG tablet Take 81 mg by mouth daily.      ibuprofen (ADVIL,MOTRIN) 800 MG tablet TAKE 1 TABLET(800 MG) BY MOUTH EVERY 8 HOURS AS NEEDED 90 tablet 5   insulin glargine (LANTUS) 100 UNIT/ML injection Inject 50 Units into the skin at bedtime.     Insulin Syringe-Needle U-100 (INSULIN SYRINGE .5CC/31GX5/16") 31G X 5/16" 0.5 ML MISC      magnesium hydroxide (MILK OF MAGNESIA) 400 MG/5ML suspension Take 15 mLs by mouth daily.     metFORMIN (GLUCOPHAGE) 1000 MG tablet  Take 1,000 mg by mouth 2 (two) times daily with a meal.     Olmesartan-amLODIPine-HCTZ 20-5-12.5 MG TABS Take by mouth.     simvastatin (ZOCOR) 20 MG tablet Take 20 mg by mouth every evening.     No current facility-administered medications for this visit.    Allergies   Allergies as of 11/02/2022 - Review Complete 11/25/2020  Allergen Reaction Noted   Tramadol Swelling 11/02/2022    Past Medical History   Past Medical History:  Diagnosis Date   Arthritis    Diabetes mellitus    High cholesterol    HTN (hypertension)    Joint disorder    Morbid obesity due to excess calories (HCC)    Plantar fascial fibromatosis    Polyosteoarthritis     Past Surgical History   Past Surgical History:  Procedure Laterality Date   COLONOSCOPY N/A 10/13/2012   Procedure: COLONOSCOPY;  Surgeon: West Bali, MD;  Location: [facility];  Service: Endoscopy;  Laterality: N/A;  8:30 AM   TOTAL KNEE ARTHROPLASTY  11/30/2011   Procedure: TOTAL KNEE ARTHROPLASTY;  Surgeon: Vickki Hearing, MD;  Location: [facility];  Service: Orthopedics;  Laterality: Right;    Past Family History   Family History  Problem Relation Age of Onset   Coronary artery disease Mother    Diabetes Sister    Heart attack Brother    Hypertension Father    Diabetes Father    Heart disease Unknown    Arthritis  Unknown    Lung disease Unknown    Kidney disease Unknown    Diabetes Son     Past Social History   Social History   Socioeconomic History   Marital status: Widowed    Spouse name: Not on file   Number of children: Not on file   Years of education: college   Highest education level: Not on file  Occupational History   Occupation: Lobbyist: [employer]  Tobacco Use   Smoking status: Never   Smokeless tobacco: Never  Vaping Use   Vaping status: Never Used  Substance and Sexual Activity   Alcohol use: No   Drug use: No   Sexual activity: Yes  Other Topics Concern   Not on file  Social  History Narrative   Not on file   Social Determinants of Health   Financial Resource Strain: Not on file  Food Insecurity: Not on file  Transportation Needs: Not on file  Physical Activity: Not on file  Stress: Not on file  Social Connections: Not on file  Intimate Partner Violence: Not on file    Review of Systems   General: Negative for anorexia, weight loss, fever, chills, fatigue, weakness. Eyes: Negative for vision changes.  ENT: Negative for hoarseness, difficulty swallowing , nasal congestion. CV: Negative for chest pain, angina, palpitations, dyspnea on exertion, peripheral edema.  Respiratory: Negative for dyspnea at rest, dyspnea on exertion, cough, sputum, wheezing.  GI: See history of present illness. GU:  Negative for dysuria, hematuria, urinary incontinence, urinary frequency, nocturnal urination.  MS: Positive for knee pain, needs joint replacement but would have to go to rehab since her son and husband have passed and she would have no assistance at home. No low back pain.  Derm: Negative for rash or itching.  Neuro: Negative for weakness, abnormal sensation, seizure, frequent headaches, memory loss,  confusion.  Psych: Negative for anxiety, depression, suicidal ideation, hallucinations.  Endo: Negative for unusual weight change.  Heme: Negative for bruising or bleeding. Allergy: Negative for rash or hives.  Physical Exam   BP 131/67   Pulse 86   Temp 97.8 F (36.6 C)   Ht 5\' 7"  (1.702 m)   Wt 265 lb 3.2 oz (120.3 kg)   BMI 41.54 kg/m    General: Well-nourished, well-developed in no acute distress.  Head: Normocephalic, atraumatic.   Eyes: Conjunctiva pink, no icterus. Mouth: Oropharyngeal mucosa moist and pink   Neck: Supple without thyromegaly, masses, or lymphadenopathy.  Lungs: Clear to auscultation bilaterally.  Heart: Regular rate and rhythm, no murmurs rubs or gallops.  Abdomen: Bowel sounds are normal, nontender, nondistended, no  hepatosplenomegaly or masses,  no abdominal bruits or hernia, no rebound or guarding.   Rectal:  not performed Extremities: No lower extremity edema. No clubbing or deformities.  Neuro: Alert and oriented x 4 , grossly normal neurologically.  Skin: Warm and dry, no rash or jaundice.   Psych: Alert and cooperative, normal mood and affect.  Labs   None available  Imaging Studies   No results found.  Assessment   *Change in bowel habits *H/O adenomatous colon polyps  Has noted change in stool caliber, having difficulty passing stool unless her stools are very soft.  Taking a laxative every morning and milk of magnesia every evening.  Avoiding constipating foods.  Recommend updated colonoscopy at this time to rule out stricture, malignancy.   PLAN   Colonoscopy in the near future with Dr. Marletta Lor.  ASA 3.  I have discussed the risks, alternatives, benefits with regards to but not limited to the risk of reaction to medication, bleeding, infection, perforation and the patient is agreeable to proceed. Written consent to be obtained. Patient to call back with name of laxative that she is taking.    Leanna Battles. Melvyn Neth, MHS, PA-C [facility]

## 2022-11-02 NOTE — Telephone Encounter (Signed)
Spoke with pt while scheduling her colonoscopy. She is taking Bisacodyl 5mg  in the morning after breakfast.

## 2022-11-03 ENCOUNTER — Encounter: Payer: Self-pay | Admitting: *Deleted

## 2022-11-13 DIAGNOSIS — E1165 Type 2 diabetes mellitus with hyperglycemia: Secondary | ICD-10-CM | POA: Diagnosis not present

## 2022-11-13 DIAGNOSIS — I10 Essential (primary) hypertension: Secondary | ICD-10-CM | POA: Diagnosis not present

## 2022-11-18 NOTE — Patient Instructions (Signed)
DERIN LILIENTHAL  11/18/2022     @PREFPERIOPPHARMACY @   Your procedure is scheduled on  11/27/2022.   Report to [facility] at  0845  A.M.   Call this number if you have problems the morning of surgery:  [phone]  If you experience any cold or flu symptoms such as cough, fever, chills, shortness of breath, etc. between now and your scheduled surgery, please notify us at the above number.   Remember:  Follow the diet and prep instructions given to you by the office.      Take 25 units of your insulin the night before your procedure.      DO NOT take any medications for diabetes the morning of your procedure.     Take these medicines the morning of surgery with A SIP OF WATER                                           None.     Do not wear jewelry, make-up or nail polish, including gel polish,  artificial nails, or any other type of covering on natural nails (fingers and  toes).  Do not wear lotions, powders, or perfumes, or deodorant.  Do not shave 48 hours prior to surgery.  Men may shave face and neck.  Do not bring valuables to the hospital.  [facility] is not responsible for any belongings or valuables.  Contacts, dentures or bridgework may not be worn into surgery.  Leave your suitcase in the car.  After surgery it may be brought to your room.  For patients admitted to the hospital, discharge time will be determined by your treatment team.  Patients discharged the day of surgery will not be allowed to drive home and must have someone with them for 24 hours.    Special instructions:   DO NOT smoke tobacco or vape for 24 hours before your procedure.  Please read over the following fact sheets that you were given. Anesthesia Post-op Instructions and Care and Recovery After Surgery      Colonoscopy, Adult, Care After The following information offers guidance on how to care for yourself after your procedure. Your health care provider may also give you more  specific instructions. If you have problems or questions, contact your health care provider. What can I expect after the procedure? After the procedure, it is common to have: A small amount of blood in your stool for 24 hours after the procedure. Some gas. Mild cramping or bloating of your abdomen. Follow these instructions at home: Eating and drinking  Drink enough fluid to keep your urine pale yellow. Follow instructions from your health care provider about eating or drinking restrictions. Resume your normal diet as told by your health care provider. Avoid heavy or fried foods that are hard to digest. Activity Rest as told by your health care provider. Avoid sitting for a long time without moving. Get up to take short walks every 1-2 hours. This is important to improve blood flow and breathing. Ask for help if you feel weak or unsteady. Return to your normal activities as told by your health care provider. Ask your health care provider what activities are safe for you. Managing cramping and bloating  Try walking around when you have cramps or feel bloated. If directed, apply heat to your abdomen as told by  your health care provider. Use the heat source that your health care provider recommends, such as a moist heat pack or a heating pad. Place a towel between your skin and the heat source. Leave the heat on for 20-30 minutes. Remove the heat if your skin turns bright red. This is especially important if you are unable to feel pain, heat, or cold. You have a greater risk of getting burned. General instructions If you were given a sedative during the procedure, it can affect you for several hours. Do not drive or operate machinery until your health care provider says that it is safe. For the first 24 hours after the procedure: Do not sign important documents. Do not drink alcohol. Do your regular daily activities at a slower pace than normal. Eat soft foods that are easy to digest. Take  over-the-counter and prescription medicines only as told by your health care provider. Keep all follow-up visits. This is important. Contact a health care provider if: You have blood in your stool 2-3 days after the procedure. Get help right away if: You have more than a small spotting of blood in your stool. You have large blood clots in your stool. You have swelling of your abdomen. You have nausea or vomiting. You have a fever. You have increasing pain in your abdomen that is not relieved with medicine. These symptoms may be an emergency. Get help right away. Call 911. Do not wait to see if the symptoms will go away. Do not drive yourself to the hospital. Summary After the procedure, it is common to have a small amount of blood in your stool. You may also have mild cramping and bloating of your abdomen. If you were given a sedative during the procedure, it can affect you for several hours. Do not drive or operate machinery until your health care provider says that it is safe. Get help right away if you have a lot of blood in your stool, nausea or vomiting, a fever, or increased pain in your abdomen. This information is not intended to replace advice given to you by your health care provider. Make sure you discuss any questions you have with your health care provider. Document Revised: 05/12/2022 Document Reviewed: 11/20/2020 Elsevier Patient Education  2024 [employer] Monitored Anesthesia Care, Care After The following information offers guidance on how to care for yourself after your procedure. Your health care provider may also give you more specific instructions. If you have problems or questions, contact your health care provider. What can I expect after the procedure? After the procedure, it is common to have: Tiredness. Little or no memory about what happened during or after the procedure. Impaired judgment when it comes to making decisions. Nausea or vomiting. Some trouble  with balance. Follow these instructions at home: For the time period you were told by your health care provider:  Rest. Do not participate in activities where you could fall or become injured. Do not drive or use machinery. Do not drink alcohol. Do not take sleeping pills or medicines that cause drowsiness. Do not make important decisions or sign legal documents. Do not take care of children on your own. Medicines Take over-the-counter and prescription medicines only as told by your health care provider. If you were prescribed antibiotics, take them as told by your health care provider. Do not stop using the antibiotic even if you start to feel better. Eating and drinking Follow instructions from your health care provider about what you may eat  and drink. Drink enough fluid to keep your urine pale yellow. If you vomit: Drink clear fluids slowly and in small amounts as you are able. Clear fluids include water, ice chips, low-calorie sports drinks, and fruit juice that has water added to it (diluted fruit juice). Eat light and bland foods in small amounts as you are able. These foods include bananas, applesauce, rice, lean meats, toast, and crackers. General instructions  Have a responsible adult stay with you for the time you are told. It is important to have someone help care for you until you are awake and alert. If you have sleep apnea, surgery and some medicines can increase your risk for breathing problems. Follow instructions from your health care provider about wearing your sleep device: When you are sleeping. This includes during daytime naps. While taking prescription pain medicines, sleeping medicines, or medicines that make you drowsy. Do not use any products that contain nicotine or tobacco. These products include cigarettes, chewing tobacco, and vaping devices, such as e-cigarettes. If you need help quitting, ask your health care provider. Contact a health care provider  if: You feel nauseous or vomit every time you eat or drink. You feel light-headed. You are still sleepy or having trouble with balance after 24 hours. You get a rash. You have a fever. You have redness or swelling around the IV site. Get help right away if: You have trouble breathing. You have new confusion after you get home. These symptoms may be an emergency. Get help right away. Call 911. Do not wait to see if the symptoms will go away. Do not drive yourself to the hospital. This information is not intended to replace advice given to you by your health care provider. Make sure you discuss any questions you have with your health care provider. Document Revised: 08/25/2021 Document Reviewed: 08/25/2021 Elsevier Patient Education  2024 [employer].

## 2022-11-23 ENCOUNTER — Encounter (HOSPITAL_COMMUNITY): Payer: Self-pay

## 2022-11-23 ENCOUNTER — Other Ambulatory Visit: Payer: Self-pay

## 2022-11-23 ENCOUNTER — Encounter (HOSPITAL_COMMUNITY)
Admission: RE | Admit: 2022-11-23 | Discharge: 2022-11-23 | Disposition: A | Discharge status: Home / Self Care | Payer: Medicare Other | Source: Ambulatory Visit | Attending: Internal Medicine | Admitting: Internal Medicine

## 2022-11-23 VITALS — BP 131/67 | HR 86 | Temp 97.8°F | Resp 18 | Ht 67.0 in | Wt 265.2 lb

## 2022-11-23 DIAGNOSIS — I10 Essential (primary) hypertension: Secondary | ICD-10-CM

## 2022-11-23 DIAGNOSIS — Z01812 Encounter for preprocedural laboratory examination: Secondary | ICD-10-CM | POA: Diagnosis not present

## 2022-11-23 DIAGNOSIS — Z01810 Encounter for preprocedural cardiovascular examination: Secondary | ICD-10-CM | POA: Diagnosis not present

## 2022-11-23 DIAGNOSIS — Z01818 Encounter for other preprocedural examination: Secondary | ICD-10-CM | POA: Diagnosis present

## 2022-11-23 DIAGNOSIS — E119 Type 2 diabetes mellitus without complications: Secondary | ICD-10-CM | POA: Insufficient documentation

## 2022-11-23 LAB — BASIC METABOLIC PANEL
Anion gap: 12 (ref 5–15)
BUN: 21 mg/dL (ref 8–23)
CO2: 23 mmol/L (ref 22–32)
Calcium: 9.1 mg/dL (ref 8.9–10.3)
Chloride: 101 mmol/L (ref 98–111)
Creatinine, Ser: 0.78 mg/dL (ref 0.44–1.00)
GFR, Estimated: >60 mL/min (ref >60)
Glucose, Bld: 78 mg/dL (ref 70–99)
Potassium: 4 mmol/L (ref 3.5–5.1)
Sodium: 136 mmol/L (ref 135–145)

## 2022-11-27 ENCOUNTER — Ambulatory Visit (HOSPITAL_BASED_OUTPATIENT_CLINIC_OR_DEPARTMENT_OTHER): Payer: Medicare Other | Admitting: Anesthesiology

## 2022-11-27 ENCOUNTER — Ambulatory Visit (HOSPITAL_COMMUNITY): Payer: Medicare Other | Admitting: Anesthesiology

## 2022-11-27 ENCOUNTER — Ambulatory Visit (HOSPITAL_COMMUNITY)
Admission: RE | Admit: 2022-11-27 | Discharge: 2022-11-27 | Disposition: A | Discharge status: Home / Self Care | Payer: Medicare Other | Attending: Internal Medicine | Admitting: Internal Medicine

## 2022-11-27 ENCOUNTER — Encounter (HOSPITAL_COMMUNITY)
Admission: RE | Disposition: A | Discharge status: Home / Self Care | Payer: Self-pay | Source: Home / Self Care | Attending: Internal Medicine

## 2022-11-27 ENCOUNTER — Encounter (HOSPITAL_COMMUNITY): Payer: Self-pay

## 2022-11-27 DIAGNOSIS — K5730 Diverticulosis of large intestine without perforation or abscess without bleeding: Secondary | ICD-10-CM | POA: Diagnosis not present

## 2022-11-27 DIAGNOSIS — E119 Type 2 diabetes mellitus without complications: Secondary | ICD-10-CM | POA: Diagnosis not present

## 2022-11-27 DIAGNOSIS — K5900 Constipation, unspecified: Secondary | ICD-10-CM | POA: Insufficient documentation

## 2022-11-27 DIAGNOSIS — D123 Benign neoplasm of transverse colon: Secondary | ICD-10-CM | POA: Insufficient documentation

## 2022-11-27 DIAGNOSIS — Z7984 Long term (current) use of oral hypoglycemic drugs: Secondary | ICD-10-CM | POA: Insufficient documentation

## 2022-11-27 DIAGNOSIS — K648 Other hemorrhoids: Secondary | ICD-10-CM | POA: Insufficient documentation

## 2022-11-27 DIAGNOSIS — K6389 Other specified diseases of intestine: Secondary | ICD-10-CM | POA: Diagnosis not present

## 2022-11-27 DIAGNOSIS — Z1211 Encounter for screening for malignant neoplasm of colon: Secondary | ICD-10-CM

## 2022-11-27 DIAGNOSIS — E7800 Pure hypercholesterolemia, unspecified: Secondary | ICD-10-CM

## 2022-11-27 DIAGNOSIS — Z79899 Other long term (current) drug therapy: Secondary | ICD-10-CM | POA: Diagnosis not present

## 2022-11-27 DIAGNOSIS — K635 Polyp of colon: Secondary | ICD-10-CM | POA: Diagnosis not present

## 2022-11-27 DIAGNOSIS — Z794 Long term (current) use of insulin: Secondary | ICD-10-CM | POA: Diagnosis not present

## 2022-11-27 DIAGNOSIS — I10 Essential (primary) hypertension: Secondary | ICD-10-CM

## 2022-11-27 DIAGNOSIS — Z86010 Personal history of colonic polyps: Secondary | ICD-10-CM | POA: Insufficient documentation

## 2022-11-27 DIAGNOSIS — Z6841 Body Mass Index (BMI) 40.0 and over, adult: Secondary | ICD-10-CM | POA: Diagnosis not present

## 2022-11-27 DIAGNOSIS — R198 Other specified symptoms and signs involving the digestive system and abdomen: Secondary | ICD-10-CM

## 2022-11-27 HISTORY — PX: POLYPECTOMY: SHX5525

## 2022-11-27 HISTORY — PX: COLONOSCOPY WITH PROPOFOL: SHX5780

## 2022-11-27 LAB — GLUCOSE, CAPILLARY
Glucose-Capillary: 67 mg/dL — ABNORMAL LOW (ref 70–99)
Glucose-Capillary: 106 mg/dL — ABNORMAL HIGH (ref 70–99)
Glucose-Capillary: 153 mg/dL — ABNORMAL HIGH (ref 70–99)

## 2022-11-27 SURGERY — COLONOSCOPY WITH PROPOFOL
Anesthesia: General

## 2022-11-27 MED ORDER — LIDOCAINE HCL (CARDIAC) PF 100 MG/5ML IV SOSY
PREFILLED_SYRINGE | INTRAVENOUS | Status: DC | PRN
Start: 2022-11-27 — End: 2022-11-27
  Administered 2022-11-27: 60 mg via INTRAVENOUS

## 2022-11-27 MED ORDER — LACTATED RINGERS IV SOLN: INTRAVENOUS | Status: DC

## 2022-11-27 MED ORDER — PROPOFOL 10 MG/ML IV BOLUS
INTRAVENOUS | Status: DC | PRN
  Administered 2022-11-27: 10 mg via INTRAVENOUS
  Administered 2022-11-27 (×3): 20 mg via INTRAVENOUS
  Administered 2022-11-27: 80 mg via INTRAVENOUS
  Administered 2022-11-27 (×3): 20 mg via INTRAVENOUS

## 2022-11-27 MED ORDER — DEXTROSE 50 % IV SOLN
INTRAVENOUS | Status: AC
  Administered 2022-11-27 (×2): 25 mL
  Filled 2022-11-27: qty 50

## 2022-11-27 MED ORDER — LACTATED RINGERS IV SOLN: INTRAVENOUS | Status: DC | PRN

## 2022-11-27 NOTE — Anesthesia Preprocedure Evaluation (Addendum)
Anesthesia Evaluation  Patient identified by MRN, date of birth, ID band Patient awake    Reviewed: Allergy & Precautions, H&P , NPO status , Patient's Chart, lab work & pertinent test results  Airway Mallampati: II  TM Distance: >3 FB Neck ROM: Full    Dental  (+) Dental Advisory Given, Poor Dentition, Chipped, Missing   Pulmonary neg pulmonary ROS   Pulmonary exam normal breath sounds clear to auscultation       Cardiovascular Exercise Tolerance: Good hypertension, Pt. on medications  Rhythm:Irregular Rate:Normal     Neuro/Psych negative neurological ROS  negative psych ROS   GI/Hepatic negative GI ROS, Neg liver ROS,,,  Endo/Other  diabetes, Well Controlled, Type 2, Oral Hypoglycemic Agents, Insulin Dependent  Morbid obesity  Renal/GU negative Renal ROS  negative genitourinary   Musculoskeletal  (+) Arthritis , Osteoarthritis,    Abdominal   Peds negative pediatric ROS (+)  Hematology negative hematology ROS (+)   Anesthesia Other Findings   Reproductive/Obstetrics negative OB ROS                             Anesthesia Physical Anesthesia Plan  ASA: 3  Anesthesia Plan: General   Post-op Pain Management: Minimal or no pain anticipated   Induction: Intravenous  PONV Risk Score and Plan: 1 and Propofol infusion  Airway Management Planned: Nasal Cannula and Natural Airway  Additional Equipment:   Intra-op Plan:   Post-operative Plan:   Informed Consent: I have reviewed the patients History and Physical, chart, labs and discussed the procedure including the risks, benefits and alternatives for the proposed anesthesia with the patient or authorized representative who has indicated his/her understanding and acceptance.     Dental advisory given  Plan Discussed with: CRNA and Surgeon  Anesthesia Plan Comments:        Anesthesia Quick Evaluation

## 2022-11-27 NOTE — Anesthesia Postprocedure Evaluation (Signed)
Anesthesia Post Note  Patient: [name] Price  Procedure(s) Performed: COLONOSCOPY WITH PROPOFOL POLYPECTOMY  Patient location during evaluation: Phase II Anesthesia Type: General Level of consciousness: awake and alert and oriented Pain management: pain level controlled Vital Signs Assessment: post-procedure vital signs reviewed and stable Respiratory status: spontaneous breathing, nonlabored ventilation and respiratory function stable Cardiovascular status: blood pressure returned to baseline and stable Postop Assessment: no apparent nausea or vomiting Anesthetic complications: no  No notable events documented.   Last Vitals:  Vitals:   11/27/22 1037 11/27/22 1038  BP: (!) 100/53 121/68  Pulse: 78   Resp: 19 18  Temp: 36.6 C   SpO2: 98% 98%    Last Pain:  Vitals:   11/27/22 1038  TempSrc:   PainSc: 0-No pain                 Meshawn Oconnor C Kaisyn Millea

## 2022-11-27 NOTE — Discharge Instructions (Addendum)
  Colonoscopy Discharge Instructions  Read the instructions outlined below and refer to this sheet in the next few weeks. These discharge instructions provide you with general information on caring for yourself after you leave the hospital. Your doctor may also give you specific instructions. While your treatment has been planned according to the most current medical practices available, unavoidable complications occasionally occur.   ACTIVITY You may resume your regular activity, but move at a slower pace for the next 24 hours.  Take frequent rest periods for the next 24 hours.  Walking will help get rid of the air and reduce the bloated feeling in your belly (abdomen).  No driving for 24 hours (because of the medicine (anesthesia) used during the test).   Do not sign any important legal documents or operate any machinery for 24 hours (because of the anesthesia used during the test).  NUTRITION Drink plenty of fluids.  You may resume your normal diet as instructed by your doctor.  Begin with a light meal and progress to your normal diet. Heavy or fried foods are harder to digest and may make you feel sick to your stomach (nauseated).  Avoid alcoholic beverages for 24 hours or as instructed.  MEDICATIONS You may resume your normal medications unless your doctor tells you otherwise.  WHAT YOU CAN EXPECT TODAY Some feelings of bloating in the abdomen.  Passage of more gas than usual.  Spotting of blood in your stool or on the toilet paper.  IF YOU HAD POLYPS REMOVED DURING THE COLONOSCOPY: No aspirin products for 7 days or as instructed.  No alcohol for 7 days or as instructed.  Eat a soft diet for the next 24 hours.  FINDING OUT THE RESULTS OF YOUR TEST Not all test results are available during your visit. If your test results are not back during the visit, make an appointment with your caregiver to find out the results. Do not assume everything is normal if you have not heard from your  caregiver or the medical facility. It is important for you to follow up on all of your test results.  SEEK IMMEDIATE MEDICAL ATTENTION IF: You have more than a spotting of blood in your stool.  Your belly is swollen (abdominal distention).  You are nauseated or vomiting.  You have a temperature over 101.  You have abdominal pain or discomfort that is severe or gets worse throughout the day.   Your colonoscopy revealed 1 polyp(s) which I removed successfully. Await pathology results, my office will contact you.  Given your age, I do not think we need to perform further colonoscopy for polyp surveillance.  You also have diverticulosis and internal hemorrhoids. I would recommend increasing fiber in your diet or adding OTC Benefiber/Metamucil. Be sure to drink at least 4 to 6 glasses of water daily. Follow-up with GI in 3-4 motnhs   I hope you have a great rest of your week!  Hennie Duos. Marletta Lor, D.O. Gastroenterology and Hepatology [facility]

## 2022-11-27 NOTE — Transfer of Care (Signed)
Immediate Anesthesia Transfer of Care Note  Patient: [name] Price  Procedure(s) Performed: COLONOSCOPY WITH PROPOFOL POLYPECTOMY  Patient Location: PACU  Anesthesia Type:General  Level of Consciousness: awake, alert , oriented, and patient cooperative  Airway & Oxygen Therapy: Patient Spontanous Breathing and Patient connected to nasal cannula oxygen  Post-op Assessment: Report given to RN, Post -op Vital signs reviewed and stable, and Patient moving all extremities X 4  Post vital signs: Reviewed and stable  Last Vitals:  Vitals Value Taken Time  BP    Temp    Pulse    Resp    SpO2      Last Pain:  Vitals:   11/27/22 1008  TempSrc:   PainSc: 0-No pain       VSS and numerics entered by PACU RN Vikki Ports.  Complications: No notable events documented.

## 2022-11-27 NOTE — Interval H&P Note (Signed)
History and Physical Interval Note:  11/27/2022 10:01 AM  [name] Price  has presented today for surgery, with the diagnosis of change in bowels.  The various methods of treatment have been discussed with the patient and family. After consideration of risks, benefits and other options for treatment, the patient has consented to  Procedure(s) with comments: COLONOSCOPY WITH PROPOFOL (N/A) - 10:45 am, asa 3 as a surgical intervention.  The patient's history has been reviewed, patient examined, no change in status, stable for surgery.  I have reviewed the patient's chart and labs.  Questions were answered to the patient's satisfaction.     Lanelle Bal

## 2022-11-27 NOTE — Op Note (Signed)
[facility] Patient Name: [name] Price Procedure Date: 11/27/2022 9:51 AM MRN: [id number] Date of Birth: [birth year] Attending MD: Hennie Duos. Marletta Lor , [place], [id number] CSN: [id number] Age: 72 Admit Type: Outpatient Procedure:                Colonoscopy Indications:              Screening for colorectal malignant neoplasm Providers:                Hennie Duos. Marletta Lor, DO, Angelica Ran, Yolanda Bonine Referring MD:              Medicines:                See the Anesthesia note for documentation of the                            administered medications Complications:            No immediate complications. Estimated Blood Loss:     Estimated blood loss was minimal. Procedure:                Pre-Anesthesia Assessment:                           - The anesthesia plan was to use monitored                            anesthesia care (MAC).                           After obtaining informed consent, the colonoscope                            was passed under direct vision. Throughout the                            procedure, the patient's blood pressure, pulse, and                            oxygen saturations were monitored continuously. The                            [id number]-HQ190L ([id number]) scope was introduced through                            the anus and advanced to the the cecum, identified                            by appendiceal orifice and ileocecal valve. The                            colonoscopy was performed without difficulty. The                            patient tolerated the procedure well.  The quality                            of the bowel preparation was evaluated using the                            BBPS [facility] Bowel Preparation Scale) with scores                            of: Right Colon = 3, Transverse Colon = 3 and Left                            Colon = 3 (entire mucosa seen well with no residual                             staining, small fragments of stool or opaque                            liquid). The total BBPS score equals 9. Scope In: 10:14:23 AM Scope Out: 10:30:32 AM Scope Withdrawal Time: 0 hours 11 minutes 4 seconds  Total Procedure Duration: 0 hours 16 minutes 9 seconds  Findings:      Non-bleeding internal hemorrhoids were found during endoscopy.      Multiple medium-mouthed and small-mouthed diverticula were found in the       sigmoid colon.      A 3 mm polyp was found in the transverse colon. The polyp was sessile.       The polyp was removed with a cold snare. Resection and retrieval were       complete.      The exam was otherwise without abnormality. Impression:               - Non-bleeding internal hemorrhoids.                           - Diverticulosis in the sigmoid colon.                           - One 3 mm polyp in the transverse colon, removed                            with a cold snare. Resected and retrieved.                           - The examination was otherwise normal. Moderate Sedation:      Per Anesthesia Care Recommendation:           - Patient has a contact number available for                            emergencies. The signs and symptoms of potential                            delayed complications were discussed with the  patient. Return to normal activities tomorrow.                            Written discharge instructions were provided to the                            patient.                           - Resume previous diet.                           - Continue present medications.                           - Await pathology results.                           - No repeat colonoscopy due to age.                           - Return to GI clinic in 3 months. Procedure Code(s):        --- Professional ---                           [phone], Colonoscopy, flexible; with removal of                            tumor(s), polyp(s), or other  lesion(s) by snare                            technique Diagnosis Code(s):        --- Professional ---                           Z12.11, Encounter for screening for malignant                            neoplasm of colon                           K64.8, Other hemorrhoids                           D12.3, Benign neoplasm of transverse colon (hepatic                            flexure or splenic flexure)                           K57.30, Diverticulosis of large intestine without                            perforation or abscess without bleeding CPT copyright 2022 [employer]. All rights reserved. The codes documented in this report are preliminary and upon coder review may  be revised to meet current compliance requirements. Hennie Duos. Marletta Lor, DO Hennie Duos. Marletta Lor,  DO 11/27/2022 10:32:55 AM This report has been signed electronically. Number of Addenda: 0

## 2022-12-01 LAB — SURGICAL PATHOLOGY

## 2022-12-02 ENCOUNTER — Encounter (HOSPITAL_COMMUNITY): Payer: Self-pay | Admitting: Internal Medicine

## 2022-12-14 DIAGNOSIS — I10 Essential (primary) hypertension: Secondary | ICD-10-CM | POA: Diagnosis not present

## 2022-12-14 DIAGNOSIS — E1165 Type 2 diabetes mellitus with hyperglycemia: Secondary | ICD-10-CM | POA: Diagnosis not present

## 2023-01-04 ENCOUNTER — Other Ambulatory Visit (HOSPITAL_COMMUNITY): Payer: Self-pay | Admitting: Internal Medicine

## 2023-01-04 DIAGNOSIS — Z1231 Encounter for screening mammogram for malignant neoplasm of breast: Secondary | ICD-10-CM

## 2023-01-13 DIAGNOSIS — E1165 Type 2 diabetes mellitus with hyperglycemia: Secondary | ICD-10-CM | POA: Diagnosis not present

## 2023-01-13 DIAGNOSIS — I10 Essential (primary) hypertension: Secondary | ICD-10-CM | POA: Diagnosis not present

## 2023-01-28 ENCOUNTER — Encounter: Payer: Self-pay | Admitting: Internal Medicine

## 2023-01-28 DIAGNOSIS — Z23 Encounter for immunization: Secondary | ICD-10-CM | POA: Diagnosis not present

## 2023-02-13 DIAGNOSIS — E1165 Type 2 diabetes mellitus with hyperglycemia: Secondary | ICD-10-CM | POA: Diagnosis not present

## 2023-02-13 DIAGNOSIS — I10 Essential (primary) hypertension: Secondary | ICD-10-CM | POA: Diagnosis not present

## 2023-02-15 ENCOUNTER — Ambulatory Visit (HOSPITAL_COMMUNITY)
Admission: RE | Admit: 2023-02-15 | Discharge: 2023-02-15 | Disposition: A | Discharge status: Home / Self Care | Payer: Medicare Other | Source: Ambulatory Visit | Attending: Internal Medicine | Admitting: Internal Medicine

## 2023-02-15 ENCOUNTER — Encounter (HOSPITAL_COMMUNITY): Payer: Self-pay

## 2023-02-15 DIAGNOSIS — I10 Essential (primary) hypertension: Secondary | ICD-10-CM | POA: Diagnosis not present

## 2023-02-15 DIAGNOSIS — Z1231 Encounter for screening mammogram for malignant neoplasm of breast: Secondary | ICD-10-CM | POA: Diagnosis not present

## 2023-02-15 DIAGNOSIS — E1165 Type 2 diabetes mellitus with hyperglycemia: Secondary | ICD-10-CM | POA: Diagnosis not present

## 2023-03-15 DIAGNOSIS — E1165 Type 2 diabetes mellitus with hyperglycemia: Secondary | ICD-10-CM | POA: Diagnosis not present

## 2023-03-15 DIAGNOSIS — I10 Essential (primary) hypertension: Secondary | ICD-10-CM | POA: Diagnosis not present

## 2023-04-15 DIAGNOSIS — E119 Type 2 diabetes mellitus without complications: Secondary | ICD-10-CM | POA: Diagnosis not present

## 2023-04-15 DIAGNOSIS — M159 Polyosteoarthritis, unspecified: Secondary | ICD-10-CM | POA: Diagnosis not present

## 2023-04-15 DIAGNOSIS — I10 Essential (primary) hypertension: Secondary | ICD-10-CM | POA: Diagnosis not present

## 2023-05-20 DIAGNOSIS — I10 Essential (primary) hypertension: Secondary | ICD-10-CM | POA: Diagnosis not present

## 2023-05-20 DIAGNOSIS — E1165 Type 2 diabetes mellitus with hyperglycemia: Secondary | ICD-10-CM | POA: Diagnosis not present

## 2023-06-17 DIAGNOSIS — I10 Essential (primary) hypertension: Secondary | ICD-10-CM | POA: Diagnosis not present

## 2023-06-17 DIAGNOSIS — E1165 Type 2 diabetes mellitus with hyperglycemia: Secondary | ICD-10-CM | POA: Diagnosis not present

## 2023-07-14 IMAGING — MG MM DIGITAL SCREENING BILAT W/ TOMO AND CAD
6 of 10 series · 6 of 30 positions shown · non-contrast
Comparison: Previous exam(s).

CLINICAL DATA: Screening.

EXAM:
DIGITAL SCREENING BILATERAL MAMMOGRAM WITH TOMOSYNTHESIS AND CAD
TECHNIQUE: Bilateral screening digital craniocaudal and mediolateral oblique
mammograms were obtained. Bilateral screening digital breast
tomosynthesis was performed. The images were evaluated with
computer-aided detection.

[R CV synth-2D]
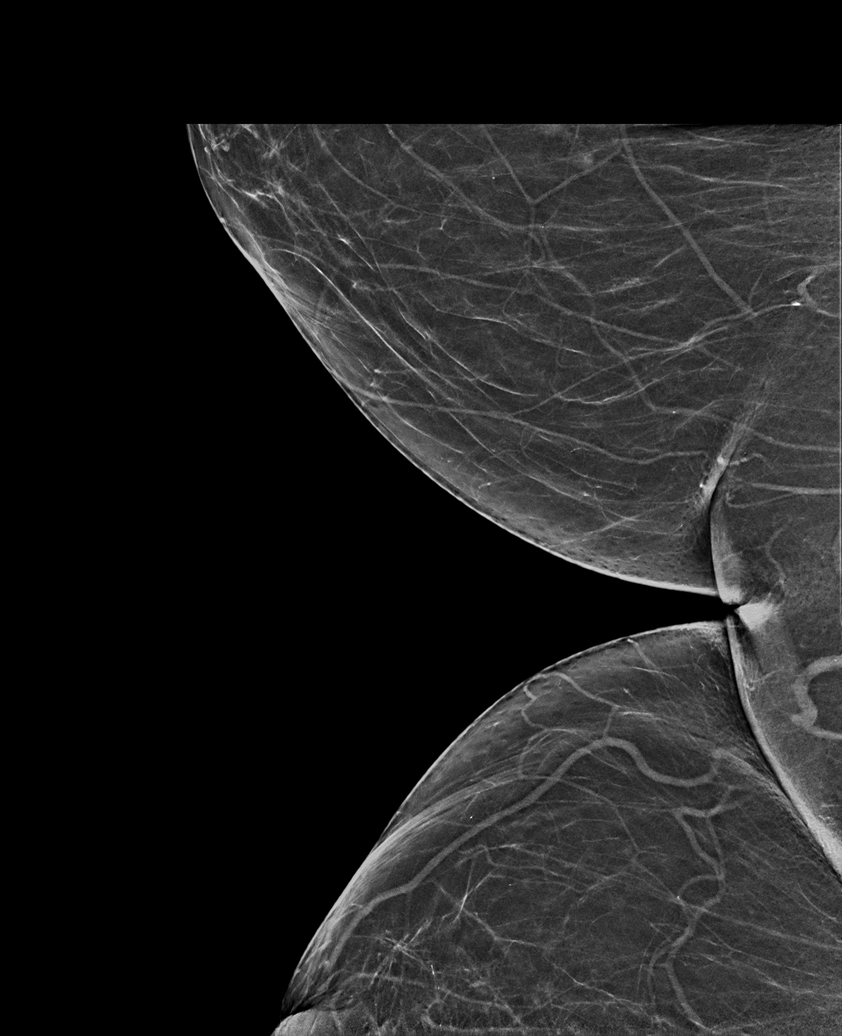

[L MLO synth-2D]
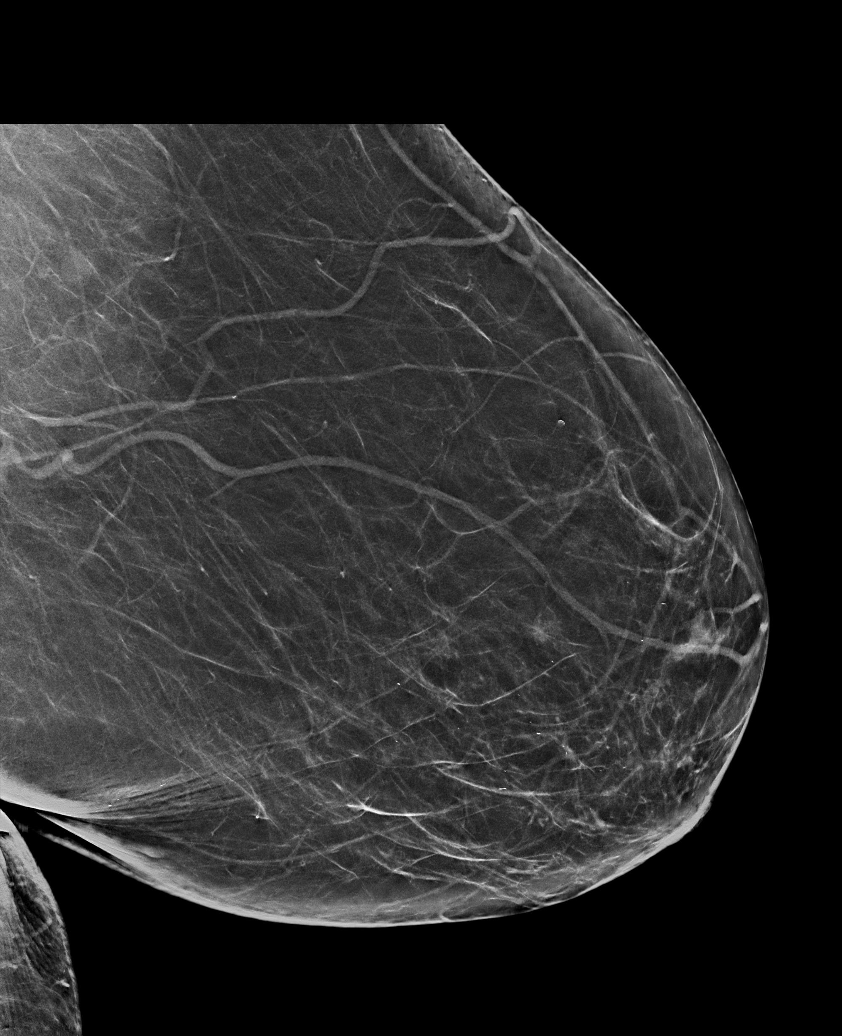

[L CC synth-2D]
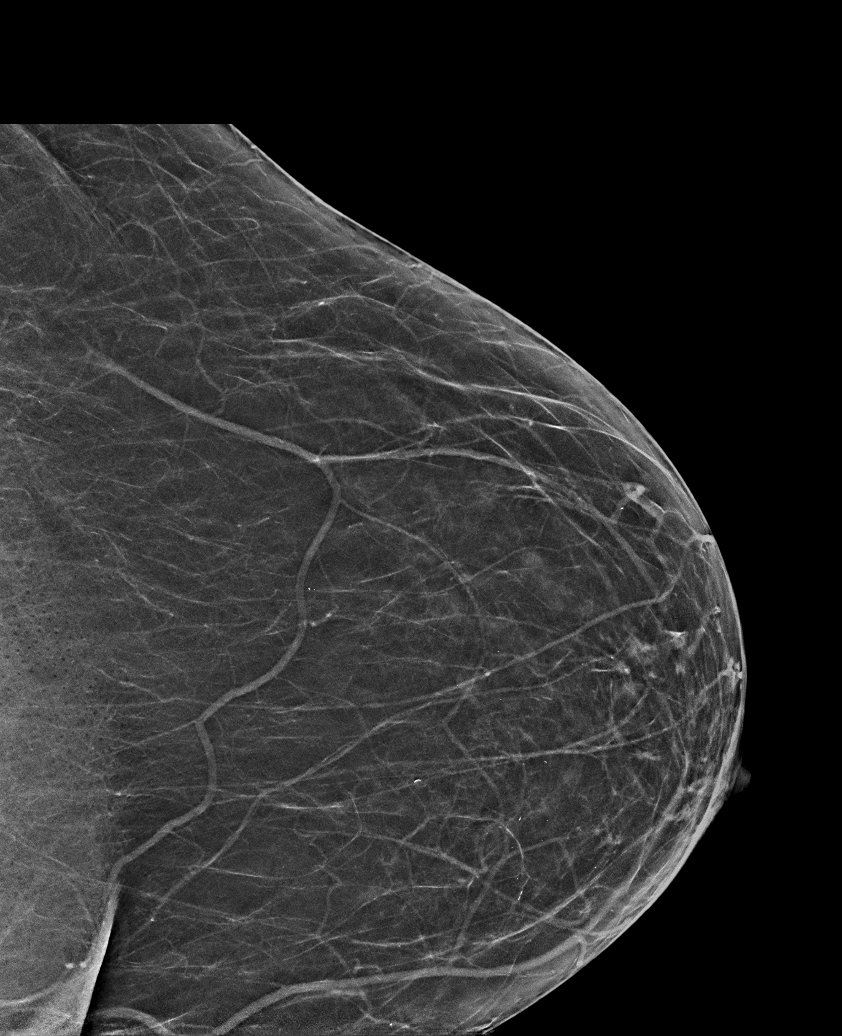

[R MLO synth-2D]
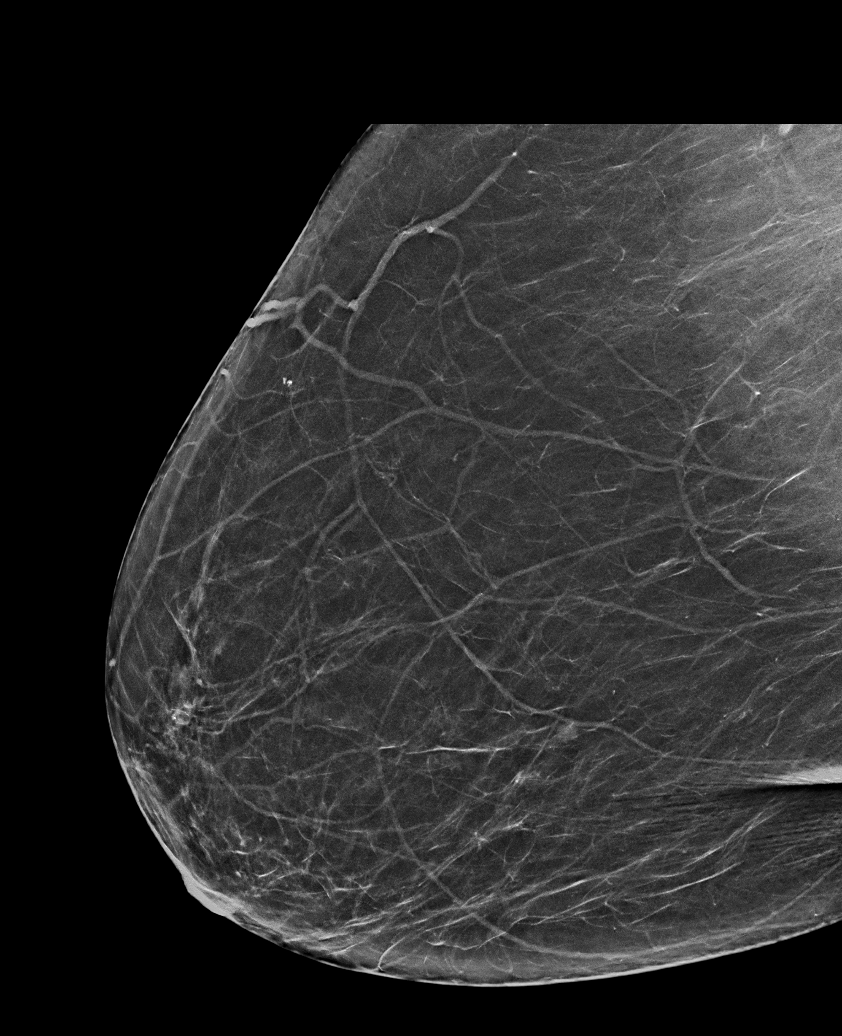

[R CC synth-2D]
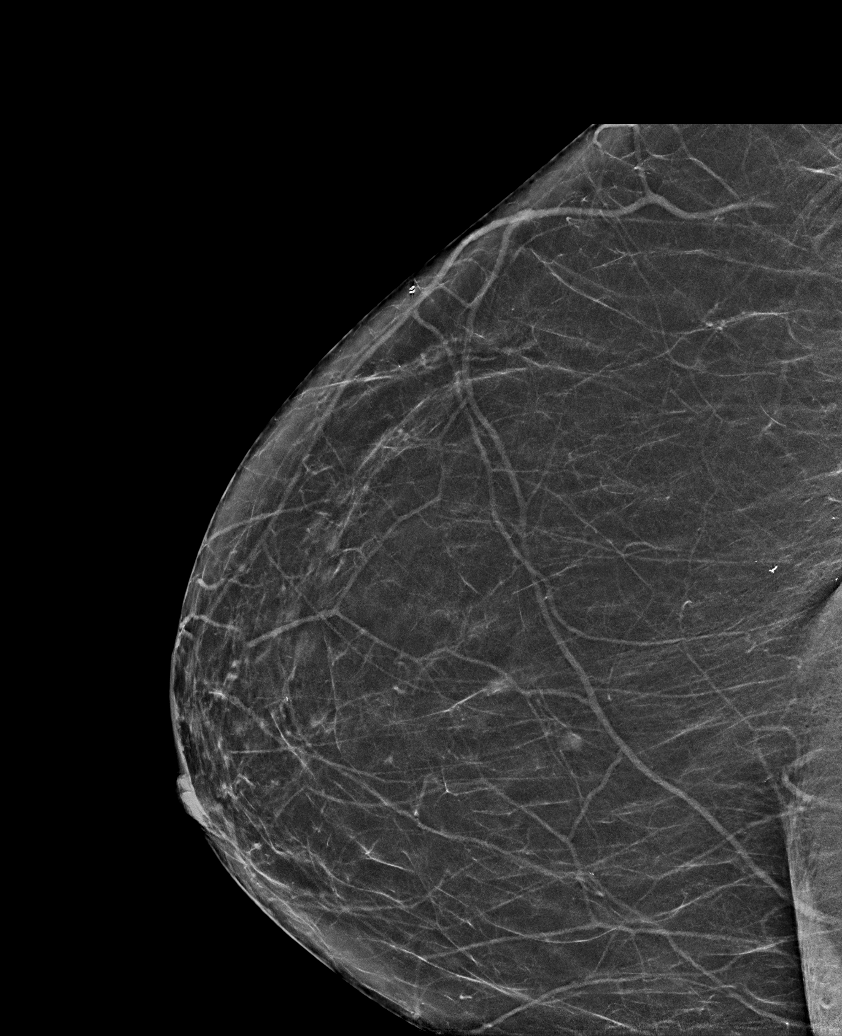

[L CC tomo · tomo slice 29/57.0]
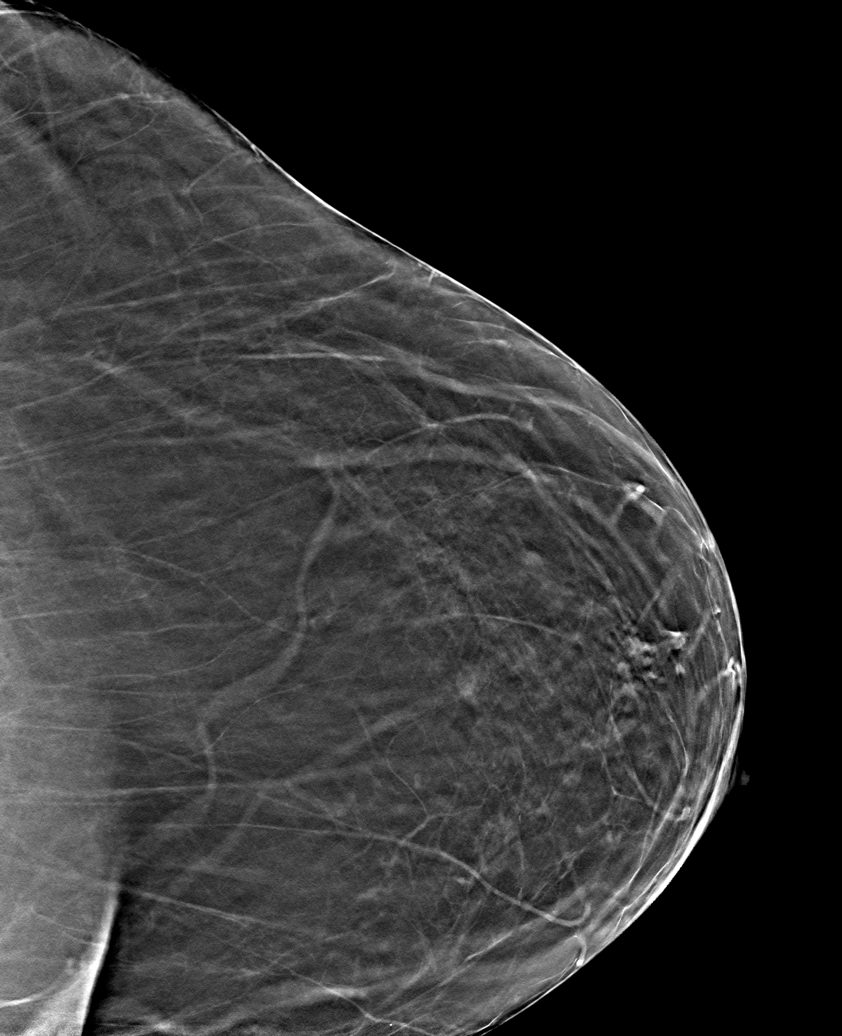

[6 of 30 positions shown; findings below may reference images not displayed]

ACR Breast Density Category b: There are scattered areas of
fibroglandular density.
FINDINGS: There are no findings suspicious for malignancy.
IMPRESSION: No mammographic evidence of malignancy. A result letter of this
screening mammogram will be mailed directly to the patient.

RECOMMENDATION:
Screening mammogram in one year. (Code:51-O-LD2)

BI-RADS CATEGORY  1: Negative.

## 2023-07-18 DIAGNOSIS — E1165 Type 2 diabetes mellitus with hyperglycemia: Secondary | ICD-10-CM | POA: Diagnosis not present

## 2023-07-18 DIAGNOSIS — I10 Essential (primary) hypertension: Secondary | ICD-10-CM | POA: Diagnosis not present

## 2023-07-26 DIAGNOSIS — I10 Essential (primary) hypertension: Secondary | ICD-10-CM | POA: Diagnosis not present

## 2023-07-26 DIAGNOSIS — E119 Type 2 diabetes mellitus without complications: Secondary | ICD-10-CM | POA: Diagnosis not present

## 2023-07-26 DIAGNOSIS — J219 Acute bronchiolitis, unspecified: Secondary | ICD-10-CM | POA: Diagnosis not present

## 2023-08-25 DIAGNOSIS — E1165 Type 2 diabetes mellitus with hyperglycemia: Secondary | ICD-10-CM | POA: Diagnosis not present

## 2023-08-25 DIAGNOSIS — I10 Essential (primary) hypertension: Secondary | ICD-10-CM | POA: Diagnosis not present

## 2023-09-25 DIAGNOSIS — E1165 Type 2 diabetes mellitus with hyperglycemia: Secondary | ICD-10-CM | POA: Diagnosis not present

## 2023-09-25 DIAGNOSIS — I10 Essential (primary) hypertension: Secondary | ICD-10-CM | POA: Diagnosis not present

## 2023-10-13 DIAGNOSIS — E119 Type 2 diabetes mellitus without complications: Secondary | ICD-10-CM | POA: Diagnosis not present

## 2023-10-13 DIAGNOSIS — M159 Polyosteoarthritis, unspecified: Secondary | ICD-10-CM | POA: Diagnosis not present

## 2023-10-13 DIAGNOSIS — Z1389 Encounter for screening for other disorder: Secondary | ICD-10-CM | POA: Diagnosis not present

## 2023-10-13 DIAGNOSIS — Z96651 Presence of right artificial knee joint: Secondary | ICD-10-CM | POA: Diagnosis not present

## 2023-10-13 DIAGNOSIS — I10 Essential (primary) hypertension: Secondary | ICD-10-CM | POA: Diagnosis not present

## 2023-10-13 DIAGNOSIS — Z0001 Encounter for general adult medical examination with abnormal findings: Secondary | ICD-10-CM | POA: Diagnosis not present

## 2023-10-21 DIAGNOSIS — E119 Type 2 diabetes mellitus without complications: Secondary | ICD-10-CM | POA: Diagnosis not present

## 2023-10-21 DIAGNOSIS — I10 Essential (primary) hypertension: Secondary | ICD-10-CM | POA: Diagnosis not present

## 2023-11-13 DIAGNOSIS — I10 Essential (primary) hypertension: Secondary | ICD-10-CM | POA: Diagnosis not present

## 2023-11-13 DIAGNOSIS — E1165 Type 2 diabetes mellitus with hyperglycemia: Secondary | ICD-10-CM | POA: Diagnosis not present

## 2023-12-14 DIAGNOSIS — E1165 Type 2 diabetes mellitus with hyperglycemia: Secondary | ICD-10-CM | POA: Diagnosis not present

## 2023-12-14 DIAGNOSIS — I10 Essential (primary) hypertension: Secondary | ICD-10-CM | POA: Diagnosis not present

## 2024-01-03 DIAGNOSIS — E119 Type 2 diabetes mellitus without complications: Secondary | ICD-10-CM | POA: Diagnosis not present

## 2024-01-11 ENCOUNTER — Other Ambulatory Visit (HOSPITAL_COMMUNITY): Payer: Self-pay | Admitting: Internal Medicine

## 2024-01-11 DIAGNOSIS — Z1231 Encounter for screening mammogram for malignant neoplasm of breast: Secondary | ICD-10-CM

## 2024-01-13 DIAGNOSIS — I10 Essential (primary) hypertension: Secondary | ICD-10-CM | POA: Diagnosis not present

## 2024-01-13 DIAGNOSIS — E1165 Type 2 diabetes mellitus with hyperglycemia: Secondary | ICD-10-CM | POA: Diagnosis not present

## 2024-01-25 DIAGNOSIS — Z23 Encounter for immunization: Secondary | ICD-10-CM | POA: Diagnosis not present

## 2024-02-18 ENCOUNTER — Ambulatory Visit (HOSPITAL_COMMUNITY)
Admission: RE | Admit: 2024-02-18 | Discharge: 2024-02-18 | Disposition: A | Discharge status: Home / Self Care | Source: Ambulatory Visit | Attending: Internal Medicine | Admitting: Internal Medicine

## 2024-02-18 ENCOUNTER — Encounter (HOSPITAL_COMMUNITY): Payer: Self-pay

## 2024-02-18 DIAGNOSIS — Z1231 Encounter for screening mammogram for malignant neoplasm of breast: Secondary | ICD-10-CM | POA: Diagnosis present
